# Patient Record
Sex: Female | Born: 1950
Health system: Southern US, Community
[De-identification: ages and names within clinical notes are randomized; demographics above are authoritative.]

## PROBLEM LIST (undated history)

## (undated) DIAGNOSIS — E785 Hyperlipidemia, unspecified: Secondary | ICD-10-CM

## (undated) DIAGNOSIS — R519 Headache, unspecified: Secondary | ICD-10-CM

## (undated) DIAGNOSIS — C801 Malignant (primary) neoplasm, unspecified: Secondary | ICD-10-CM

## (undated) DIAGNOSIS — R51 Headache: Secondary | ICD-10-CM

## (undated) DIAGNOSIS — I10 Essential (primary) hypertension: Secondary | ICD-10-CM

## (undated) DIAGNOSIS — E119 Type 2 diabetes mellitus without complications: Secondary | ICD-10-CM

## (undated) DIAGNOSIS — Z8489 Family history of other specified conditions: Secondary | ICD-10-CM

## (undated) DIAGNOSIS — M1990 Unspecified osteoarthritis, unspecified site: Secondary | ICD-10-CM

## (undated) HISTORY — PX: ELBOW BURSA SURGERY: SHX615

## (undated) HISTORY — PX: OTHER SURGICAL HISTORY: SHX169

## (undated) HISTORY — PX: JOINT REPLACEMENT: SHX530

## (undated) HISTORY — DX: Hyperlipidemia, unspecified: E78.5

## (undated) HISTORY — DX: Malignant (primary) neoplasm, unspecified: C80.1

## (undated) HISTORY — PX: BREAST SURGERY: SHX581

## (undated) HISTORY — DX: Headache, unspecified: R51.9

## (undated) HISTORY — DX: Headache: R51

## (undated) HISTORY — DX: Unspecified osteoarthritis, unspecified site: M19.90

## (undated) HISTORY — PX: FOOT SURGERY: SHX648

## (undated) HISTORY — DX: Type 2 diabetes mellitus without complications: E11.9

## (undated) HISTORY — DX: Essential (primary) hypertension: I10

## (undated) HISTORY — PX: NASAL POLYP EXCISION: SHX2068

---

## 2006-05-12 HISTORY — PX: TOTAL KNEE ARTHROPLASTY: SHX125

## 2008-01-07 ENCOUNTER — Inpatient Hospital Stay (HOSPITAL_COMMUNITY): Admission: RE | Admit: 2008-01-07 | Discharge: 2008-01-11 | Payer: Self-pay | Admitting: Orthopedic Surgery

## 2010-09-24 NOTE — Op Note (Signed)
NAMEDELORUS, LANGWELL NO.:  [id number]   MEDICAL RECORD NO.:  [id number]          PATIENT TYPE:  INP   LOCATION:  0012                         FACILITY:  [facility]   PHYSICIAN:  Ollen Gross, M.D.    DATE OF BIRTH:  [birth year]   DATE OF PROCEDURE:  01/07/2008  DATE OF DISCHARGE:                               OPERATIVE REPORT   PREOPERATIVE DIAGNOSIS:  Osteoarthritis bilateral knees.   POSTOPERATIVE DIAGNOSIS:  Osteoarthritis bilateral knees.   PROCEDURE:  Bilateral total knee arthroplasty.   SURGEON:  Ollen Gross, MD.   ASSISTANT:  Avel Peace, PA-C.   ANESTHESIA:  General with postop epidural.   ESTIMATED BLOOD LOSS:  Minimal.   DRAIN:  Autovac x1 each side.   TOURNIQUET TIME:  On the right 35 minutes at 300 mmHg, on the left 36  minutes at 300 mmHg.   COMPLICATIONS:  None.   CONDITION:  Stable to recovery.   BRIEF CLINICAL NOTE:  Tammy Harris is a 60 year old female with end-stage  arthritis of both knees with progressively worsening pain and  dysfunction.  She has failed nonoperative management and presents now  for bilateral total knee arthroplasty.  We gave her the option of doing  one at a time vs. bilateral and she opted for bilateral given the pain  and dysfunction in both knees.   PROCEDURE IN DETAIL:  After the successful administration of the  epidural catheter and then general anesthetic, the patient is placed  supine on the operating table and the tourniquet is placed high on both  thighs, both lower extremities prepped and draped in the usual sterile  fashion.  Extremities wrapped in Esmarch, knee flexed and tourniquet  inflated to 300 mmHg on the right.  We did the right side first because  that was more symptomatic.  An incision was made with a 10-blade through  the subcutaneous tissue to the level of the extensor mechanism.  A fresh  blade was used to make a medial parapatellar arthrotomy.  The soft  tissue over the proximal and medial  tibia is subperiosteally elevated to  the joint line with the knife and at the semimembranosus bursa with a  Cobb elevator.  Soft tissue laterally is elevated with attention being  paid to avoid the patellar tendon on tibial tubercle.  Patella subluxed  laterally, knee flexed 90 degrees and ACL and PCL removed.  Drill was  used to create a starting hole in the distal femur and the canal was  thoroughly irrigated.  The 5 degree right valgus alignment guide is  placed referencing off the posterior condyles, rotation is marked and  the block pinned to remove 11 mm of the distal femur.  I took 11 because  of a preoperative flexion contracture.  A distal femoral resection is  made an oscillating saw.  Sizing blocks placed, a size 3 is the most  appropriate.  Rotation is marked off the epicondylar axis.  A size 3  cutting block is placed and the anterior, posterior and chamfer cuts  made.   Tibia subluxed forward and menisci removed.  The extramedullary tibial  alignment guide is placed referencing proximally at the medial aspect of  the tibial tubercle and distally along the second metatarsal axis of the  tibial crest.  Block is pinned to remove 10 mm of the nondeficient  lateral side.  Tibial resection is made with an oscillating saw.  A size  3 is the most appropriate tibial component and then the proximal tibia  is prepared with the modular drill and keel punch for a size 3.  Femoral  preparation is completed with the intercondylar cut.   A size 3 mobile bearing tibial trial, size 3 posterior stabilized  femoral trial and a 10 mm posterior stabilized rotating platform insert  trial are placed.  With the 10, full extension is achieved with  excellent varus-valgus, anterior-posterior balance throughout full range  of motion.  Patella was everted and thickness measured to be 22 mm.  Freehand resection was taken down to 13 mm, a 35 template is placed, lug  holes were drilled, trial patella  was placed and it tracks normally.  Osteophytes were removed over the posterior femur with the trial in  place.  All trials were removed and the cut bone surfaces are prepared  with pulsatile lavage.  Cement is mixed and once ready for implantation  the size 3 mobile bearing tibial tray, size 3 posterior stabilized femur  and 35 patella are cemented into place and patella was held with the  clamp.  The 10 mm trial inserts placed, knee held in full extension and  all extruded cement removed.  When the cement fully hardened then the  permanent 10 mm posterior stabilized rotating platform insert is placed  into the tibial tray.  The wound was copiously irrigated with saline  solution.  The extensor mechanism was closed over an Autovac drain with  interrupted #1 PDS suture.  Flexion against gravity is 135 degrees.  Subcu is then closed with interrupted #2-0 Vicryl and subcuticular  running #4-0 Monocryl.   The left side is then addressed.  The left lower extremity is wrapped in  Esmarch, knee flexed and tourniquet inflated to 300 mmHg.  A midline  incision was made with a 10-blade through subcutaneous tissue to the  level of the extensor mechanism.  A fresh blade was used to make a  medial parapatellar arthrotomy.  The same approach was used as on the  right.  Soft tissue balancing is performed.  Patella was subluxed and  knee flexed 90 degrees.  ACL and PCL were removed.  The drill was used  to create a starting hole in the distal femur, canal was thoroughly  irrigated.  A 5 degree left valgus alignment guide is placed.  Referencing off the posterior condyles, rotation is marked, block pinned  to remove 11 mm of the distal femur because of a preop flexion  contracture.  Distal femoral resection is made with an oscillating saw.  A size 3 is again the most appropriate size.  Rotation is marked at the  epicondylar axis.  A size 3 cutting block is placed and the anterior,  posterior and chamfer  cuts made.   Tibia subluxed forward, menisci removed.  Extramedullary tibial  alignment guide is placed referencing proximally off the medial aspect  of the tibial tubercle and distally along the second metatarsal axis  tibial crest.  Blocks pinned to remove about 4 mm from the more  deficient medial side.  Both sides had some deficiency on this left  tibia.  Tibial resection is made  with an oscillating saw.  A size 3 is  the most appropriate tibial component and the proximal tibia is prepared  with the modular drill and keel punch for a size 3.  Femoral preparation  was completed with the intercondylar cut.   A size 3 mobile bearing tibial trial, size 3 posterior stabilized  femoral trial, and a 10 mm posterior stabilized rotating platform insert  trial are placed.  Full extension is achieved with excellent varus and  valgus, anterior and posterior balance throughout, full range of motion.  Patella was everted, thickness measured to be 22 mm.  Freehand resection  was taken to 13 mm, a 35 template is placed, lug holes were drilled,  trial patella was placed and it tracks normally.  Osteophytes removed  off the posterior femur with the trial in place.  All trials were  removed then the cut bone surfaces prepared with pulsatile lavage.  Cement was mixed and once ready for implantation a size three mobile  bearing tibial tray, size 3 posterior stabilized femur and 35 patella  are cemented into place and the patella was held with a clamp.  Trial 10  mm inserts were placed, the knee held into full extension and all  extruded cement removed.  Once cement was fully hardened, then the  permanent 10 mm posterior stabilized rotating platform insert is placed  into the tibial tray.  The wound was copiously irrigated with saline  solution and the extensor mechanism closed over an Autovac drain with  interrupted #1 PDS.  Flexion against gravity was 135 degrees.  Tourniquet was released for a total  time of 36 minutes.  The subcu was  closed with interrupted #2-0 Vicryl, subcuticular running #4-0 Monocryl.  Both incisions are cleaned and dried and Steri-Strips and bulky sterile  dressings applied.  The drains had already been hooked to suction.  She  is then placed into knee immobilizers, awakened and transported to  recovery in stable condition.      Ollen Gross, M.D.  Electronically Signed     FA/MEDQ  D:  01/07/2008  T:  01/07/2008  Job:  [id number]

## 2010-09-27 NOTE — Discharge Summary (Signed)
Tammy Harris, Tammy Harris             ACCOUNT NO.:  [id number]   MEDICAL RECORD NO.:  [id number]          PATIENT TYPE:  INP   LOCATION:  1604                         FACILITY:  [facility]   PHYSICIAN:  Ollen Gross, M.D.    DATE OF BIRTH:  1950/12/21   DATE OF ADMISSION:  01/07/2008  DATE OF DISCHARGE:  01/11/2008                               DISCHARGE SUMMARY   ADMISSION DIAGNOSES:  1. Osteoarthritis of bilateral knees.  2. Hypertension.  3. Hypercholesterolemia.  4. Diarrhea, type irritable bowel syndrome.  5. Vitamin D deficiency.  6. Mild stress urinary incontinence.  7. Hyperglycemia.  8. Postmenopausal.   DISCHARGE DIAGNOSES:  1. Osteoarthritis of bilateral knees, status post bilateral total knee      replacement arthroplasty.  2. Postoperative blood loss anemia.  3. Hypertension.  4. Hypercholesterolemia.  5. Diarrhea, type irritable bowel syndrome.  6. Vitamin D deficiency.  7. Mild stress urinary incontinence.  8. Hyperglycemia.  9. Postmenopausal.   PROCEDURE:  January 07, 2008, bilateral total knee replacement  arthroplasties.   SURGEON:  Ollen Gross, M.D.   ASSISTANT:  Alexzandrew L. Perkins, P.A.C.   ANESTHESIA:  General, postoperative epidural.   CONSULTATIONS:  None.   BRIEF HISTORY:  Tammy Harris is a 60 year old female with end-stage arthritis  of both knees, progressive worsening pain and dysfunction, who failed  operative management and now presents for bilateral total knee  replacement arthroplasties.  She was given the option to do one at a  time versus bilateral.  She opted to do bilateral given the pain.   LABORATORY DATA:  Preop CBC showed hemoglobin 13.5, hematocrit 40.7,  white cell count 5.4, platelets 237, postop hemoglobin 10.1, drifted  down to 8.8, then 8.5, last H&H was 8.3 and 24.7.  PT/PTT preop 13.5 and  22 respectively.  INR 1.0.  Serial pro-times followed.  PT/INR 20.6 and  1.7.  Chem panel on admission all within normal limits.  Serial  BMETs  were followed.  Electrolytes remained within normal limits.  Glucose  went up from 89 to 135,  back down to 121.  Preop UA:  Large hemoglobin,  0-2 white cells.  Blood group type O+.   DIAGNOSTICS:  1. Chest x-ray December 31, 2007:  No acute disease.  2. EKG December 20, 2007:  Sinus rhythm, this is an unconfirmed EKG.   HOSPITAL COURSE:  The patient was admitted to [facility] and  tolerated the procedure well.  Later transferred to the recovery room on  the orthopedic floor.  She did have an epidural placed postoperatively  which was managed by anesthesia.  Epidural was left in for 48 hours.  On  postop day 1, she was actually pretty comfortable and had good control  with the epidural.  She was started on Coumadin, but was not started  until the evening of postop day 1.  By postop day 2, she was doing well.  Epidural was removed and she was started on Lovenox.  After the epidural  was removed, Foley was also discontinued later that day.  Dressings were  changed.  Both incisions were healing well.  Continued just to do bed to  chair transfers with a few steps.  By postop day 3, however, she was  doing amazingly well with her therapy.  She was up walking about 135  feet.  She had already been up the hallway on the evening of day 2.  She  has continued to progress well.  Day 3, she had remained on the Lovenox  bridge until her INR was therapeutic.  Her hemoglobin was low down to  8.5 noted on postop day 3, but she was asymptomatic with this and  hemodynamically stable.  Her blood pressure medications were held though  because her systolic pressure was only about 110-115.  It would be  resumed if her pressure came back up.  Continued to progress well.  By  postop day 4, she was doing great, walking the hall.  She was tolerating  her meds.  Her pressure was stable.  Despite the low hemoglobin of 8.3,  she was hemodynamically stable with no complaints, wanted to go home and   arrangements were made.  She was discharged home later that day.   DISPOSITION:  The patient was discharged home on January 11, 2008.  Weightbearing as tolerated to both lower extremities.  Home health PT  and home nursing total knee protocol.   DISCHARGE MEDICATIONS:  1. Coumadin.  2. Nu-Iron.  3. Percocet.  4. Robaxin.   DIET:  Low-sodium, low-cholesterol, heart-healthy diet.   FOLLOW UP:  Follow up on Thursday or Friday on January 20, 2008 or  January 21, 2008.  Please contact the office at [phone] for  appointment.   CONDITION ON DISCHARGE:  Improving.      Alexzandrew L. Perkins, P.A.C.      Ollen Gross, M.D.  Electronically Signed    ALP/MEDQ  D:  02/03/2008  T:  02/04/2008  Job:  [id number]   cc:   Ollen Gross, M.D.  Fax: [phone]   Stan Head. Cleta Alberts, M.D.  Fax: [phone]

## 2010-09-27 NOTE — H&P (Signed)
NAMESHERISSA, TENENBAUM             ACCOUNT NO.:  [id number]   MEDICAL RECORD NO.:  [id number]          PATIENT TYPE:  INP   LOCATION:  NA                           FACILITY:  [facility]   PHYSICIAN:  Ollen Gross, M.D.    DATE OF BIRTH:  [birth year]   DATE OF ADMISSION:  01/07/2008  DATE OF DISCHARGE:                              HISTORY & PHYSICAL   CHIEF COMPLAINT:  Bilateral knee pain.   HISTORY OF PRESENT ILLNESS:  The patient is a 60 year old female who has  seen by Dr. Lequita Halt for ongoing bilateral knee pain.  She had problems  with her knees for quite some time now.  Historically the left has been  more symptomatic than the right.  Right now the right has been hurting a  little bit more recently.  She has been seeing Dr. Charlett Blake in the past  and recommended recommend knee replacement.  She was subsequently seen  by Dr. Lequita Halt in second opinion.  She has been seen in the office and x-  rays show end-stage arthritis of the left knee and also patellofemoral  bone-on-bone, as well as significant medial narrowing on the right knee.  She is at a point now, due to her significant arthritis, most  predictable way of improving pain and function will be total knee  arthroplasty.  She would like to have both done at the same time.  Risks  and benefits have been discussed.  She elects to proceed with surgery.  She has been seen preoperative by Dr. Cleta Alberts and felt to be low risk and  cleared medically for surgery.   ALLERGIES:  SULFA.   CURRENT MEDICATIONS:  Diclofenac, lisinopril, hydrochlorothiazide,  amitriptyline, Simvastatin, baby aspirin, loratadine, glucosamine  chondroitin, chromium, vitamin E, multivitamin, potassium, B complex,  fish oil, flaxseed oil, vitamin C, calcium, Estroven.   PAST MEDICAL HISTORY:  Hypertension, hypercholesterolemia and diarrhea-  type irritable bowel syndrome, vitamin D deficiency, a little bit of  mild stress urinary incontinence, hyperglycemia,  postmenopausal.   PAST SURGICAL HISTORY:  Cesarean section x2, heel spur surgery, elbow  surgery and carpal tunnel surgery.   SOCIAL HISTORY:  Married, housewife, nonsmoker.  No alcohol.  Husband  will be assisting with care after surgery.   FAMILY HISTORY:  Father deceased at 42, Parkinson's and heart disease.  Mother living, age 56, hypertension and elevated cholesterol.  She has 2  sisters and a brother.   REVIEW OF SYSTEMS:  GENERAL:  No fevers, chills or night sweats.  Neurologic:  No seizures, syncope or paralysis.  RESPIRATORY:  No  shortness of breath, productive cough or hemoptysis.  CARDIOVASCULAR:  No chest pain, angina, orthopnea.  GI:  No nausea, vomiting, diarrhea or  constipation.  GU:  No dysuria or hematuria, or discharge.  MUSCULOSKELETAL:  Bilateral knees.   PHYSICAL EXAMINATION:  VITAL SIGNS:  Pulse 76, respiration 12, blood  pressure 118/86.  GENERAL:  A 56-year white female, well-nourished, well-developed, no  acute distress.  She is alert and oriented, cooperative, pleasant,  accompanied by her husband, slightly overweight.  HEENT:  Normocephalic, atraumatic.  Pupils are round and  reactive.  Oropharynx clear.  EOMs intact.  NECK:  Supple.  CHEST:  Clear.  HEART:  Regular rate and rhythm.  No murmur, S1, S2 noted.  ABDOMEN:  Soft, round protuberant abdomen.  Bowel sounds present.  RECTAL, BREAST, GENITALIA:  Not done, refer to present illness.  EXTREMITIES:  Knees, right knee range of motion 5-120, marked crepitus  tender more medial than the lateral.  Left knee range of motion 10-115,  marked crepitus tender more medial than lateral.   IMPRESSION:  Osteoarthritis of bilateral knees.   PLAN:  The patient admitted to [facility] to undergo bilateral  total knee replacement arthroplasty.  Surgery will be performed by Dr.  Ollen Gross.      Alexzandrew L. Perkins, P.A.C.      Ollen Gross, M.D.  Electronically Signed    ALP/MEDQ  D:   01/06/2008  T:  01/06/2008  Job:  [id number]   cc:   Ollen Gross, M.D.  Fax: [phone]   Stan Head. Cleta Alberts, M.D.  Fax: [phone]

## 2011-02-12 LAB — CBC
Platelets: 196
RDW: 13.2

## 2011-02-12 LAB — PROTIME-INR
INR: 1.7 — ABNORMAL HIGH
Prothrombin Time: 20.6 — ABNORMAL HIGH

## 2011-02-12 LAB — GLUCOSE, CAPILLARY: Glucose-Capillary: 93

## 2011-08-26 ENCOUNTER — Ambulatory Visit (INDEPENDENT_AMBULATORY_CARE_PROVIDER_SITE_OTHER): Payer: Managed Care, Other (non HMO) | Admitting: Emergency Medicine

## 2011-08-26 ENCOUNTER — Ambulatory Visit: Payer: Managed Care, Other (non HMO)

## 2011-08-26 ENCOUNTER — Encounter: Payer: Self-pay | Admitting: Emergency Medicine

## 2011-08-26 VITALS — BP 130/91 | HR 87 | Temp 98.8°F | Resp 16 | Ht 64.0 in | Wt 283.2 lb

## 2011-08-26 DIAGNOSIS — Z0000 Encounter for general adult medical examination without abnormal findings: Secondary | ICD-10-CM

## 2011-08-26 DIAGNOSIS — E785 Hyperlipidemia, unspecified: Secondary | ICD-10-CM

## 2011-08-26 DIAGNOSIS — M1990 Unspecified osteoarthritis, unspecified site: Secondary | ICD-10-CM

## 2011-08-26 DIAGNOSIS — M25579 Pain in unspecified ankle and joints of unspecified foot: Secondary | ICD-10-CM

## 2011-08-26 DIAGNOSIS — I10 Essential (primary) hypertension: Secondary | ICD-10-CM

## 2011-08-26 DIAGNOSIS — M25572 Pain in left ankle and joints of left foot: Secondary | ICD-10-CM

## 2011-08-26 DIAGNOSIS — E119 Type 2 diabetes mellitus without complications: Secondary | ICD-10-CM

## 2011-08-26 DIAGNOSIS — L309 Dermatitis, unspecified: Secondary | ICD-10-CM

## 2011-08-26 DIAGNOSIS — M159 Polyosteoarthritis, unspecified: Secondary | ICD-10-CM

## 2011-08-26 DIAGNOSIS — R21 Rash and other nonspecific skin eruption: Secondary | ICD-10-CM

## 2011-08-26 LAB — POCT URINALYSIS DIPSTICK
Bilirubin, UA: NEGATIVE
Ketones, UA: NEGATIVE
Spec Grav, UA: 1.025
pH, UA: 7

## 2011-08-26 LAB — POCT UA - MICROSCOPIC ONLY
Bacteria, U Microscopic: NEGATIVE
Casts, Ur, LPF, POC: NEGATIVE
Crystals, Ur, HPF, POC: NEGATIVE
Mucus, UA: NEGATIVE

## 2011-08-26 LAB — IFOBT (OCCULT BLOOD): IFOBT: NEGATIVE

## 2011-08-26 MED ORDER — TETANUS-DIPHTH-ACELL PERTUSSIS 5-2.5-18.5 LF-MCG/0.5 IM SUSP
0.5000 mL | Freq: Once | INTRAMUSCULAR | Status: AC
  Administered 2011-08-26: 0.5 mL via INTRAMUSCULAR

## 2011-08-26 NOTE — Progress Notes (Signed)
  Subjective:    Patient ID: Tammy Harris, female    DOB: [birth year], 61 y.o.   MRN: [id number]  HPI patient enters for her yearly physical exam    Review of Systems  Constitutional: Negative.   Eyes: Negative.   Respiratory: Negative.   Cardiovascular: Negative.   Gastrointestinal: Negative.   Genitourinary:       She has difficulty with urinary incontinence.  Musculoskeletal:       He's had a lot of pain and difficulty inside of her left ankle. She may have stepped off a curb and injured herself the  Skin: Negative.   Neurological: Positive for headaches.  Hematological: Negative.   Psychiatric/Behavioral: Negative.        Objective:   Physical Exam  Constitutional: She appears well-developed and well-nourished.  HENT:  Right Ear: External ear normal.  Left Ear: External ear normal.  Eyes: Pupils are equal, round, and reactive to light.  Neck: No thyromegaly present.  Cardiovascular: Normal rate, regular rhythm, normal heart sounds and intact distal pulses.  Exam reveals no gallop and no friction rub.   No murmur heard. Pulmonary/Chest: Effort normal and breath sounds normal. No respiratory distress. She has no wheezes. She has no rales. She exhibits no tenderness.  Abdominal: She exhibits no distension. There is no tenderness. There is no rebound and no guarding.  Genitourinary: Vagina normal and uterus normal.  Musculoskeletal:       There is tenderness to palpation distal to the medial malleolus of the left ankle.  Neurological: She is alert. No cranial nerve deficit. Coordination normal.  Skin:       There is a red scaly patch medial left calf which measures approximately 2 cm.    [facility] reading (PRIMARY) by  Dr Cleta Alberts x-ray shows significant  joint space narrowing and arthritic change involving ankle joint as well as the spurring on the calcaneus  EKG left axis deviation no acute change      Assessment & Plan:  Major complaints or is to follow up mainly on her  diabetes high blood pressure. Is having some hot flashes they're not major problem she is urinary symptoms I suspect are more weight related to his musculoskeletal discomfort in her left ankle good picture for this. No change in medications. She will continue exercises on a recumbent bike or by swimming. She is placed in [place]-O. Triamcinolone mixed with eucerin for her rash

## 2011-08-26 NOTE — Patient Instructions (Signed)
Eczema Atopic dermatitis, or eczema, is an inherited type of sensitive skin. Often people with eczema have a family history of allergies, asthma, or hay fever. It causes a red itchy rash and dry scaly skin. The itchiness may occur before the skin rash and may be very intense. It is not contagious. Eczema is generally worse during the cooler winter months and often improves with the warmth of summer. Eczema usually starts showing signs in infancy. Some children outgrow eczema, but it may last through adulthood. Flare-ups may be caused by:  Eating something or contact with something you are sensitive or allergic to.   Stress.  DIAGNOSIS  The diagnosis of eczema is usually based upon symptoms and medical history. TREATMENT  Eczema cannot be cured, but symptoms usually can be controlled with treatment or avoidance of allergens (things to which you are sensitive or allergic to).  Controlling the itching and scratching.   Use over-the-counter antihistamines as directed for itching. It is especially useful at night when the itching tends to be worse.   Use over-the-counter steroid creams as directed for itching.   Scratching makes the rash and itching worse and may cause impetigo (a skin infection) if fingernails are contaminated (dirty).   Keeping the skin well moisturized with creams every day. This will seal in moisture and help prevent dryness. Lotions containing alcohol and water can dry the skin and are not recommended.   Limiting exposure to allergens.   Recognizing situations that cause stress.   Developing a plan to manage stress.  HOME CARE INSTRUCTIONS   Take prescription and over-the-counter medicines as directed by your caregiver.   Do not use anything on the skin without checking with your caregiver.   Keep baths or showers short (5 minutes) in warm (not hot) water. Use mild cleansers for bathing. You may add non-perfumed bath oil to the bath water. It is best to avoid soap and  bubble bath.   Immediately after a bath or shower, when the skin is still damp, apply a moisturizing ointment to the entire body. This ointment should be a petroleum ointment. This will seal in moisture and help prevent dryness. The thicker the ointment the better. These should be unscented.   Keep fingernails cut short and wash hands often. If your child has eczema, it may be necessary to put soft gloves or mittens on your child at night.   Dress in clothes made of cotton or cotton blends. Dress lightly, as heat increases itching.   Avoid foods that may cause flare-ups. Common foods include cow's milk, peanut butter, eggs and wheat.   Keep a child with eczema away from anyone with fever blisters. The virus that causes fever blisters (herpes simplex) can cause a serious skin infection in children with eczema.  SEEK MEDICAL CARE IF:   Itching interferes with sleep.   The rash gets worse or is not better within one week following treatment.   The rash looks infected (pus or soft yellow scabs).   You or your child has an oral temperature above 102 F (38.9 C).   Your baby is older than 3 months with a rectal temperature of 100.5 F (38.1 C) or higher for more than 1 day.   The rash flares up after contact with someone who has fever blisters.  SEEK IMMEDIATE MEDICAL CARE IF:   Your baby is older than 3 months with a rectal temperature of 102 F (38.9 C) or higher.   Your baby is older than  3 months or younger with a rectal temperature of 100.4 F (38 C) or higher.  Document Released: 04/25/2000 Document Revised: 04/17/2011 Document Reviewed: 02/28/2009 [facility] Patient Information 2012 [place], [place].Osteoarthritis Osteoarthritis is the most common form of arthritis. It is redness, soreness, and swelling (inflammation) affecting the cartilage. Cartilage acts as a cushion, covering the ends of bones where they meet to form a joint. CAUSES  Over time, the cartilage begins to wear  away. This causes bone to rub on bone. This produces pain and stiffness in the affected joints. Factors that contribute to this problem are:  Excessive body weight.   Age.   Overuse of joints.  SYMPTOMS   People with osteoarthritis usually experience joint pain, swelling, or stiffness.   Over time, the joint may lose its normal shape.   Small deposits of bone (osteophytes) may grow on the edges of the joint.   Bits of bone or cartilage can break off and float inside the joint space. This may cause more pain and damage.   Osteoarthritis can lead to depression, anxiety, feelings of helplessness, and limitations on daily activities.  The most commonly affected joints are in the:  Ends of the fingers.   Thumbs.   Neck.   Lower back.   Knees.   Hips.  DIAGNOSIS  Diagnosis is mostly based on your symptoms and exam. Tests may be helpful, including:  X-rays of the affected joint.   A computerized magnetic scan (MRI).   Blood tests to rule out other types of arthritis.   Joint fluid tests. This involves using a needle to draw fluid from the joint and examining the fluid under a microscope.  TREATMENT  Goals of treatment are to control pain, improve joint function, maintain a normal body weight, and maintain a healthy lifestyle. Treatment approaches may include:  A prescribed exercise program with rest and joint relief.   Weight control with nutritional education.   Pain relief techniques such as:   Properly applied heat and cold.   Electric pulses delivered to nerve endings under the skin (transcutaneous electrical nerve stimulation, TENS).   Massage.   Certain supplements. Ask your caregiver before using any supplements, especially in combination with prescribed drugs.   Medicines to control pain, such as:   Acetaminophen.   Nonsteroidal anti-inflammatory drugs (NSAIDs), such as naproxen.   Narcotic or central-acting agents, such as tramadol. This drug carries  a risk of addiction and is generally prescribed for short-term use.   Corticosteroids. These can be given orally or as injection. This is a short-term treatment, not recommended for routine use.   Surgery to reposition the bones and relieve pain (osteotomy) or to remove loose pieces of bone and cartilage. Joint replacement may be needed in advanced states of osteoarthritis.  HOME CARE INSTRUCTIONS  Your caregiver can recommend specific types of exercise. These may include:  Strengthening exercises. These are done to strengthen the muscles that support joints affected by arthritis. They can be performed with weights or with exercise bands to add resistance.   Aerobic activities. These are exercises, such as brisk walking or low-impact aerobics, that get your heart pumping. They can help keep your lungs and circulatory system in shape.   Range-of-motion activities. These keep your joints limber.   Balance and agility exercises. These help you maintain daily living skills.  Learning about your condition and being actively involved in your care will help improve the course of your osteoarthritis. SEEK MEDICAL CARE IF:   You feel hot  or your skin turns red.   You develop a rash in addition to your joint pain.   You have an oral temperature above 102 F (38.9 C).  FOR MORE INFORMATION  National Institute of Arthritis and Musculoskeletal and Skin Diseases: www.niams.http://www.myers.net/ [employer] on Aging: https://walker.com/ American College of Rheumatology: www.rheumatology.org Document Released: 04/28/2005 Document Revised: 04/17/2011 Document Reviewed: 08/09/2009 [facility] Patient Information 2012 [place], [place].

## 2011-08-27 LAB — LIPID PANEL
Cholesterol: 212 mg/dL — ABNORMAL HIGH (ref 0–200)
HDL: 57 mg/dL (ref >39)
Total CHOL/HDL Ratio: 3.7 Ratio

## 2011-08-27 LAB — CBC WITH DIFFERENTIAL/PLATELET
Basophils Absolute: 0 10*3/uL (ref 0.0–0.1)
Eosinophils Relative: 4 % (ref 0–5)
HCT: 44.6 % (ref 36.0–46.0)
Lymphocytes Relative: 29 % (ref 12–46)
MCHC: 32.3 g/dL (ref 30.0–36.0)
MCV: 92 fL (ref 78.0–100.0)
Monocytes Absolute: 0.5 10*3/uL (ref 0.1–1.0)
RDW: 13.2 % (ref 11.5–15.5)
WBC: 8.8 10*3/uL (ref 4.0–10.5)

## 2011-08-27 LAB — TSH: TSH: 2.029 u[IU]/mL (ref 0.350–4.500)

## 2011-08-27 LAB — COMPREHENSIVE METABOLIC PANEL
AST: 28 U/L (ref 0–37)
BUN: 18 mg/dL (ref 6–23)
Calcium: 9.9 mg/dL (ref 8.4–10.5)
Chloride: 105 mEq/L (ref 96–112)
Creat: 0.7 mg/dL (ref 0.50–1.10)

## 2011-08-29 ENCOUNTER — Telehealth: Payer: Self-pay

## 2011-08-29 LAB — PAP IG (IMAGE GUIDED)

## 2011-08-29 NOTE — Telephone Encounter (Signed)
Patient returned call for her labs  Please call with results.  [phone]

## 2011-10-07 ENCOUNTER — Ambulatory Visit: Payer: Managed Care, Other (non HMO) | Admitting: Emergency Medicine

## 2011-10-07 ENCOUNTER — Ambulatory Visit: Payer: Managed Care, Other (non HMO)

## 2011-10-07 VITALS — BP 136/91 | HR 98 | Temp 98.6°F | Resp 20 | Ht 64.0 in | Wt 286.0 lb

## 2011-10-07 DIAGNOSIS — M19079 Primary osteoarthritis, unspecified ankle and foot: Secondary | ICD-10-CM

## 2011-10-07 DIAGNOSIS — M25579 Pain in unspecified ankle and joints of unspecified foot: Secondary | ICD-10-CM

## 2011-10-07 NOTE — Progress Notes (Signed)
  Subjective:    Patient ID: Tammy Harris, female    DOB: [birth year], 61 y.o.   MRN: [id number]  HPI patient here to followup her left ankle injury. Last film revealed severe degenerative arthritic changes but no fracture. She's continued to have a significant amount of discomfort.    Review of Systems     Objective:   Physical Exam   [facility] reading (PRIMARY) by  Dr. Cleta Alberts there is a narrow medial joint space no other bony lesions are noted.  There is limited range of motion of the left ankle. There is a significant amount of swelling.     Assessment & Plan:  The patient is already require surgery on both knees. She now has arthritic changes of the left ankle. We'll get an opinion from Dr. Merlyn Albert or Dr. Lestine Box regarding her Left ankle

## 2011-10-08 ENCOUNTER — Telehealth: Payer: Self-pay

## 2011-10-08 NOTE — Telephone Encounter (Signed)
Spoke with patient and let her know that xray was ready for pick up at front desk.

## 2011-10-08 NOTE — Telephone Encounter (Signed)
Pt is needing to get xrays for her ankle and would like for husband to be able to pick them up this afternoon after work for her appt tomorrow at 100.  She was seen yesterday at 56 i believe and was unable to get a copy at that time  Best number [phone]

## 2011-10-16 ENCOUNTER — Encounter: Payer: Self-pay | Admitting: Family Medicine

## 2011-12-08 ENCOUNTER — Other Ambulatory Visit: Payer: Self-pay | Admitting: Emergency Medicine

## 2012-02-19 ENCOUNTER — Other Ambulatory Visit: Payer: Self-pay | Admitting: Emergency Medicine

## 2012-03-24 ENCOUNTER — Other Ambulatory Visit: Payer: Self-pay | Admitting: Emergency Medicine

## 2012-03-24 ENCOUNTER — Other Ambulatory Visit: Payer: Self-pay | Admitting: Physician Assistant

## 2012-03-30 ENCOUNTER — Encounter: Payer: Self-pay | Admitting: Emergency Medicine

## 2012-03-30 ENCOUNTER — Ambulatory Visit: Payer: Managed Care, Other (non HMO) | Admitting: Emergency Medicine

## 2012-03-30 ENCOUNTER — Ambulatory Visit: Payer: Managed Care, Other (non HMO)

## 2012-03-30 VITALS — BP 139/81 | HR 93 | Temp 98.5°F | Resp 16 | Ht 64.5 in | Wt 292.0 lb

## 2012-03-30 DIAGNOSIS — I10 Essential (primary) hypertension: Secondary | ICD-10-CM

## 2012-03-30 DIAGNOSIS — M79672 Pain in left foot: Secondary | ICD-10-CM

## 2012-03-30 DIAGNOSIS — M79609 Pain in unspecified limb: Secondary | ICD-10-CM

## 2012-03-30 DIAGNOSIS — E785 Hyperlipidemia, unspecified: Secondary | ICD-10-CM

## 2012-03-30 DIAGNOSIS — E1169 Type 2 diabetes mellitus with other specified complication: Secondary | ICD-10-CM | POA: Insufficient documentation

## 2012-03-30 DIAGNOSIS — I152 Hypertension secondary to endocrine disorders: Secondary | ICD-10-CM | POA: Insufficient documentation

## 2012-03-30 DIAGNOSIS — E119 Type 2 diabetes mellitus without complications: Secondary | ICD-10-CM

## 2012-03-30 DIAGNOSIS — E782 Mixed hyperlipidemia: Secondary | ICD-10-CM

## 2012-03-30 DIAGNOSIS — Z23 Encounter for immunization: Secondary | ICD-10-CM

## 2012-03-30 LAB — COMPREHENSIVE METABOLIC PANEL
AST: 20 U/L (ref 0–37)
Albumin: 4.1 g/dL (ref 3.5–5.2)
BUN: 20 mg/dL (ref 6–23)
Calcium: 10 mg/dL (ref 8.4–10.5)
Chloride: 105 mEq/L (ref 96–112)
Glucose, Bld: 119 mg/dL — ABNORMAL HIGH (ref 70–99)
Potassium: 4.3 mEq/L (ref 3.5–5.3)
Sodium: 142 mEq/L (ref 135–145)
Total Protein: 6.9 g/dL (ref 6.0–8.3)

## 2012-03-30 LAB — GLUCOSE, POCT (MANUAL RESULT ENTRY): POC Glucose: 126 mg/dl — AB (ref 70–99)

## 2012-03-30 LAB — LIPID PANEL
Cholesterol: 217 mg/dL — ABNORMAL HIGH (ref 0–200)
VLDL: 25 mg/dL (ref 0–40)

## 2012-03-30 NOTE — Progress Notes (Signed)
  Subjective:    Patient ID: Tammy Harris, female    DOB: [birth year], 61 y.o.   MRN: [id number]  HPI patient here for followup high blood pressure high cholesterol and diabetes. She's had a lot of trouble with her foot and continues to gain weight. She is under the care of Dr. Lestine Box for her foot ankle injury that has been slow to get better. She denies chest pain shortness of breath or any other specific complaints at the present time    Review of Systems     Objective:   Physical Exam physical exam reveals an obese female who is in no distress. Her neck is supple. Her chest is clear. Her cardiac exam is unremarkable  There is a 3 x 3 cm ecchymotic area over the mid left foot.  [facility] reading (PRIMARY) by  Dr. Cleta Alberts No fracture   Results for orders placed in visit on 03/30/12  POCT GLYCOSYLATED HEMOGLOBIN (HGB A1C)      Component Value Range   Hemoglobin A1C 5.7    GLUCOSE, POCT (MANUAL RESULT ENTRY)      Component Value Range   POC Glucose 126 (*) 70 - 99 mg/dl       Assessment & Plan:  Medical problems are stable at present. She does have a contusion to her left foot which is improving. X-rays were negative of this area. Her medical problems are under good control except for continued weight gain and this was addressed at this visit

## 2012-03-31 ENCOUNTER — Encounter: Payer: Self-pay | Admitting: Family Medicine

## 2012-04-23 ENCOUNTER — Other Ambulatory Visit: Payer: Self-pay | Admitting: Emergency Medicine

## 2012-04-27 ENCOUNTER — Other Ambulatory Visit: Payer: Self-pay | Admitting: Emergency Medicine

## 2012-05-24 ENCOUNTER — Other Ambulatory Visit: Payer: Self-pay | Admitting: Physician Assistant

## 2012-06-04 ENCOUNTER — Other Ambulatory Visit: Payer: Self-pay | Admitting: Emergency Medicine

## 2012-06-22 ENCOUNTER — Other Ambulatory Visit: Payer: Self-pay | Admitting: Physician Assistant

## 2012-06-22 NOTE — Telephone Encounter (Signed)
Needs office visit.

## 2012-08-04 ENCOUNTER — Other Ambulatory Visit: Payer: Self-pay | Admitting: Physician Assistant

## 2012-08-24 ENCOUNTER — Other Ambulatory Visit: Payer: Self-pay | Admitting: Emergency Medicine

## 2012-08-25 ENCOUNTER — Telehealth: Payer: Self-pay

## 2012-08-25 MED ORDER — METHOCARBAMOL 500 MG PO TABS: 500.0000 mg | ORAL_TABLET | Freq: Three times a day (TID) | ORAL | Status: DC

## 2012-08-25 NOTE — Telephone Encounter (Signed)
Patient was denied her  methocarbamol (ROBAXIN) 500 MG tablet She has an appointment with Dr. Cleta Alberts May 20th.  She states she had one refill remaining "clearly she is not abusing the medication"   CBN:  [phone]

## 2012-08-25 NOTE — Telephone Encounter (Signed)
Rx sent to pharmacy. Follow up with Dr. Cleta Alberts as planned.

## 2012-08-25 NOTE — Telephone Encounter (Signed)
Please advise on refill, was denied for office visit needed, but patient has office visit scheduled for May 20th.

## 2012-08-25 NOTE — Telephone Encounter (Signed)
Thanks, patient advised.  

## 2012-09-21 ENCOUNTER — Other Ambulatory Visit: Payer: Self-pay | Admitting: Physician Assistant

## 2012-09-28 ENCOUNTER — Encounter: Payer: Self-pay | Admitting: Emergency Medicine

## 2012-09-28 ENCOUNTER — Ambulatory Visit: Payer: Managed Care, Other (non HMO) | Admitting: Emergency Medicine

## 2012-09-28 VITALS — BP 139/71 | HR 87 | Temp 98.1°F | Resp 16 | Ht 64.5 in | Wt 304.6 lb

## 2012-09-28 DIAGNOSIS — S91009A Unspecified open wound, unspecified ankle, initial encounter: Secondary | ICD-10-CM

## 2012-09-28 DIAGNOSIS — S81801A Unspecified open wound, right lower leg, initial encounter: Secondary | ICD-10-CM

## 2012-09-28 DIAGNOSIS — E785 Hyperlipidemia, unspecified: Secondary | ICD-10-CM

## 2012-09-28 DIAGNOSIS — I10 Essential (primary) hypertension: Secondary | ICD-10-CM

## 2012-09-28 DIAGNOSIS — S81009A Unspecified open wound, unspecified knee, initial encounter: Secondary | ICD-10-CM

## 2012-09-28 DIAGNOSIS — E119 Type 2 diabetes mellitus without complications: Secondary | ICD-10-CM

## 2012-09-28 DIAGNOSIS — S81809A Unspecified open wound, unspecified lower leg, initial encounter: Secondary | ICD-10-CM

## 2012-09-28 LAB — COMPREHENSIVE METABOLIC PANEL
ALT: 32 U/L (ref 0–35)
Alkaline Phosphatase: 57 U/L (ref 39–117)
Sodium: 141 mEq/L (ref 135–145)
Total Bilirubin: 0.6 mg/dL (ref 0.3–1.2)
Total Protein: 6.7 g/dL (ref 6.0–8.3)

## 2012-09-28 LAB — LIPID PANEL
HDL: 56 mg/dL (ref >39)
LDL Cholesterol: 105 mg/dL — ABNORMAL HIGH (ref 0–99)
Total CHOL/HDL Ratio: 3.5 Ratio

## 2012-09-28 LAB — GLUCOSE, POCT (MANUAL RESULT ENTRY): POC Glucose: 146 mg/dl — AB (ref 70–99)

## 2012-09-28 MED ORDER — MUPIROCIN 2 % EX OINT: TOPICAL_OINTMENT | Freq: Three times a day (TID) | CUTANEOUS | Status: DC

## 2012-09-28 MED ORDER — DOXYCYCLINE HYCLATE 100 MG PO CAPS: 100.0000 mg | ORAL_CAPSULE | Freq: Two times a day (BID) | ORAL | Status: DC

## 2012-09-28 NOTE — Progress Notes (Signed)
  Subjective:    Patient ID: Tammy Harris, female    DOB: [birth year], 62 y.o.   MRN: [id number]  HPI  62 YO female patient comes in today for her 6 month DM check. Pt does not check sugars at home. She knows her weight is a problem. It has always fluctuated. She eats healthy. Her ankle problems give her problems with exercise. She is having problems with her hips.   Patient has a sore located on her groin area, more so on her leg that she would like evaluated. Sore began forming 3 weeks ago- it is getting better but is seems slow. She has been using antibiotic ointment on it- [facility].  Unproductive cough at random times. She has been having this for a long while now. She does not think it is of a large concern but wanted to mention it again.     Review of Systems     Objective:   Physical Exam chest is clear heart regular rate no murmurs abdomen is obese without masses. Examination of the right leg are those to open sores. The skin has been lost in these areas. On the left side there are also streaky of present  Results for orders placed in visit on 09/28/12  GLUCOSE, POCT (MANUAL RESULT ENTRY)      Result Value Range   POC Glucose 146 (*) 70 - 99 mg/dl  POCT GLYCOSYLATED HEMOGLOBIN (HGB A1C)      Result Value Range   Hemoglobin A1C 6.2          Assessment & Plan:  Hemoglobin A1c has been slowly rising I suspect secondary to her increasing weight which is up 12 pounds from her last visit. Today she is hesitant to talk with him about bypass and has been to talk with anyone in the nutritional department. She is agreeable to restart her swimming and exercising in a pool.. She has been limited because of the left ankle and left hip pain. We'll go ahead and increase the metformin to twice a day. She was placed on Bactroban ointment and doxycycline because of the open sores on the right side of her leg

## 2012-09-30 ENCOUNTER — Telehealth: Payer: Self-pay

## 2012-09-30 NOTE — Telephone Encounter (Signed)
Pt is calling back about labs. She said if you wanted to you could just send her a letter about her labs Call back number is [phone]

## 2012-10-01 NOTE — Telephone Encounter (Signed)
done

## 2012-10-01 NOTE — Telephone Encounter (Signed)
Labs mailed per her request.

## 2012-10-08 ENCOUNTER — Other Ambulatory Visit: Payer: Self-pay | Admitting: Physician Assistant

## 2012-10-08 NOTE — Telephone Encounter (Signed)
According to Dr Ellis Parents OV note from 09/28/12, he wants pt to start taking metformin BID, but it doesn't look like he wrote a new Rx then. Can someone please send in a new script instead of RFing the incorrect one?

## 2012-10-12 ENCOUNTER — Other Ambulatory Visit: Payer: Self-pay | Admitting: Physician Assistant

## 2012-11-01 ENCOUNTER — Other Ambulatory Visit: Payer: Self-pay | Admitting: Physician Assistant

## 2012-11-24 ENCOUNTER — Other Ambulatory Visit: Payer: Self-pay | Admitting: Physician Assistant

## 2012-12-20 ENCOUNTER — Other Ambulatory Visit: Payer: Self-pay | Admitting: Physician Assistant

## 2013-01-07 ENCOUNTER — Other Ambulatory Visit: Payer: Self-pay | Admitting: Physician Assistant

## 2013-01-11 ENCOUNTER — Ambulatory Visit: Payer: Managed Care, Other (non HMO) | Admitting: Emergency Medicine

## 2013-01-11 ENCOUNTER — Encounter: Payer: Self-pay | Admitting: Emergency Medicine

## 2013-01-11 VITALS — BP 144/92 | HR 95 | Temp 99.0°F | Resp 18 | Ht 64.0 in | Wt 297.0 lb

## 2013-01-11 DIAGNOSIS — Z23 Encounter for immunization: Secondary | ICD-10-CM

## 2013-01-11 DIAGNOSIS — E119 Type 2 diabetes mellitus without complications: Secondary | ICD-10-CM

## 2013-01-11 DIAGNOSIS — I10 Essential (primary) hypertension: Secondary | ICD-10-CM

## 2013-01-11 DIAGNOSIS — E785 Hyperlipidemia, unspecified: Secondary | ICD-10-CM

## 2013-01-11 LAB — COMPREHENSIVE METABOLIC PANEL
ALT: 26 U/L (ref 0–35)
CO2: 26 mEq/L (ref 19–32)
Calcium: 10 mg/dL (ref 8.4–10.5)
Chloride: 104 mEq/L (ref 96–112)
Creat: 0.77 mg/dL (ref 0.50–1.10)
Glucose, Bld: 106 mg/dL — ABNORMAL HIGH (ref 70–99)
Total Protein: 6.7 g/dL (ref 6.0–8.3)

## 2013-01-11 LAB — GLUCOSE, POCT (MANUAL RESULT ENTRY): POC Glucose: 120 mg/dl — AB (ref 70–99)

## 2013-01-11 LAB — LIPID PANEL
Cholesterol: 193 mg/dL (ref 0–200)
HDL: 51 mg/dL (ref >39)
LDL Cholesterol: 103 mg/dL — ABNORMAL HIGH (ref 0–99)
Triglycerides: 194 mg/dL — ABNORMAL HIGH (ref <150)

## 2013-01-11 LAB — POCT GLYCOSYLATED HEMOGLOBIN (HGB A1C): Hemoglobin A1C: 6.2

## 2013-01-11 NOTE — Progress Notes (Signed)
  Subjective:    Patient ID: Tammy Harris, female    DOB: December 17, 1950, 62 y.o.   MRN: [id number]  HPI patient enters for followup of her hypertension hyperlipidemia morbid obesity and diabetes. She states she's been swimming on a regular basis. She has been or can try and lose weight. She is not interested in bypass surgery or more aggressive treatment. She's not interested in seeing a nutritionist.   Review of Systems     Objective:   Physical Exam patient is morbidly obese. Her neck is supple. Her chest was clear. Heart was regular rate without murmurs. Abdomen was obese without tenderness extremity exam reveals no evidence of neuropathy.    Results for orders placed in visit on 01/11/13  GLUCOSE, POCT (MANUAL RESULT ENTRY)      Result Value Range   POC Glucose 120 (*) 70 - 99 mg/dl  POCT GLYCOSYLATED HEMOGLOBIN (HGB A1C)      Result Value Range   Hemoglobin A1C 6.2        Assessment & Plan:  Her diabetes is at goal at 6.2. She needs to continue to focus on her weight loss and exercise on regular basis and I will see her in 3 months for followup

## 2013-01-28 ENCOUNTER — Other Ambulatory Visit: Payer: Self-pay | Admitting: Physician Assistant

## 2013-03-31 ENCOUNTER — Other Ambulatory Visit: Payer: Self-pay | Admitting: Physician Assistant

## 2013-04-10 ENCOUNTER — Other Ambulatory Visit: Payer: Self-pay | Admitting: Physician Assistant

## 2013-04-26 ENCOUNTER — Other Ambulatory Visit: Payer: Self-pay | Admitting: Emergency Medicine

## 2013-05-12 DIAGNOSIS — C801 Malignant (primary) neoplasm, unspecified: Secondary | ICD-10-CM

## 2013-05-12 HISTORY — DX: Malignant (primary) neoplasm, unspecified: C80.1

## 2013-05-26 ENCOUNTER — Other Ambulatory Visit: Payer: Self-pay | Admitting: Emergency Medicine

## 2013-06-08 ENCOUNTER — Other Ambulatory Visit: Payer: Self-pay | Admitting: Emergency Medicine

## 2013-07-05 ENCOUNTER — Other Ambulatory Visit: Payer: Self-pay | Admitting: Emergency Medicine

## 2013-07-06 ENCOUNTER — Telehealth: Payer: Self-pay

## 2013-07-06 MED ORDER — METFORMIN HCL 500 MG PO TABS: ORAL_TABLET | ORAL | Status: DC

## 2013-07-06 MED ORDER — METHOCARBAMOL 500 MG PO TABS: 500.0000 mg | ORAL_TABLET | Freq: Three times a day (TID) | ORAL | Status: DC | PRN

## 2013-07-06 NOTE — Telephone Encounter (Signed)
Patient states her meds were denied.  She has a scheduled appointment with Dr. Everlene Farrier next month.   Needs enough to get her through.   metFORMIN (GLUCOPHAGE) 500 MG tablet  [phone]

## 2013-07-06 NOTE — Telephone Encounter (Signed)
Sent Rxs for 30 days and notified pt on VM.

## 2013-07-06 NOTE — Telephone Encounter (Signed)
Just keep this at 30 day supply for now until I see her for followup here

## 2013-07-06 NOTE — Telephone Encounter (Signed)
Dr Everlene Farrier, I was going to send in a 30 day RF for pt to hold her until her appt 07/19/13, but pt asked if we could send a 90 day supply instead and she also needs methocarbamol.  I have pended both but need your approval for 90 day since she is due for f/up.

## 2013-07-19 ENCOUNTER — Ambulatory Visit: Payer: Managed Care, Other (non HMO)

## 2013-07-19 ENCOUNTER — Ambulatory Visit (INDEPENDENT_AMBULATORY_CARE_PROVIDER_SITE_OTHER): Payer: Managed Care, Other (non HMO) | Admitting: Emergency Medicine

## 2013-07-19 VITALS — BP 127/81 | HR 83 | Temp 98.6°F | Resp 18 | Ht 65.0 in | Wt 293.0 lb

## 2013-07-19 DIAGNOSIS — M542 Cervicalgia: Secondary | ICD-10-CM

## 2013-07-19 DIAGNOSIS — E119 Type 2 diabetes mellitus without complications: Secondary | ICD-10-CM

## 2013-07-19 DIAGNOSIS — I10 Essential (primary) hypertension: Secondary | ICD-10-CM

## 2013-07-19 DIAGNOSIS — E785 Hyperlipidemia, unspecified: Secondary | ICD-10-CM

## 2013-07-19 DIAGNOSIS — E782 Mixed hyperlipidemia: Secondary | ICD-10-CM

## 2013-07-19 LAB — GLUCOSE, POCT (MANUAL RESULT ENTRY): POC Glucose: 132 mg/dl — AB (ref 70–99)

## 2013-07-19 LAB — POCT GLYCOSYLATED HEMOGLOBIN (HGB A1C): Hemoglobin A1C: 6

## 2013-07-19 MED ORDER — METFORMIN HCL 500 MG PO TABS: ORAL_TABLET | ORAL | Status: DC

## 2013-07-19 MED ORDER — METHOCARBAMOL 500 MG PO TABS: ORAL_TABLET | ORAL | Status: DC

## 2013-07-19 NOTE — Patient Instructions (Signed)
Cervical Radiculopathy  Cervical radiculopathy happens when a nerve in the neck is pinched or bruised by a slipped (herniated) disk or by arthritic changes in the bones of the cervical spine. This can occur due to an injury or as part of the normal aging process. Pressure on the cervical nerves can cause pain or numbness that runs from your neck all the way down into your arm and fingers.  CAUSES   There are many possible causes, including:  · Injury.  · Muscle tightness in the neck from overuse.  · Swollen, painful joints (arthritis).  · Breakdown or degeneration in the bones and joints of the spine (spondylosis) due to aging.  · Bone spurs that may develop near the cervical nerves.  SYMPTOMS   Symptoms include pain, weakness, or numbness in the affected arm and hand. Pain can be severe or irritating. Symptoms may be worse when extending or turning the neck.  DIAGNOSIS   Your caregiver will ask about your symptoms and do a physical exam. He or she may test your strength and reflexes. X-rays, CT scans, and MRI scans may be needed in cases of injury or if the symptoms do not go away after a period of time. Electromyography (EMG) or nerve conduction testing may be done to study how your nerves and muscles are working.  TREATMENT   Your caregiver may recommend certain exercises to help relieve your symptoms. Cervical radiculopathy can, and often does, get better with time and treatment. If your problems continue, treatment options may include:  · Wearing a soft collar for short periods of time.  · Physical therapy to strengthen the neck muscles.  · Medicines, such as nonsteroidal anti-inflammatory drugs (NSAIDs), oral corticosteroids, or spinal injections.  · Surgery. Different types of surgery may be done depending on the cause of your problems.  HOME CARE INSTRUCTIONS   · Put ice on the affected area.  · Put ice in a plastic bag.  · Place a towel between your skin and the bag.  · Leave the ice on for 15-20 minutes,  03-04 times a day or as directed by your caregiver.  · If ice does not help, you can try using heat. Take a warm shower or bath, or use a hot water bottle as directed by your caregiver.  · You may try a gentle neck and shoulder massage.  · Use a flat pillow when you sleep.  · Only take over-the-counter or prescription medicines for pain, discomfort, or fever as directed by your caregiver.  · If physical therapy was prescribed, follow your caregiver's directions.  · If a soft collar was prescribed, use it as directed.  SEEK IMMEDIATE MEDICAL CARE IF:   · Your pain gets much worse and cannot be controlled with medicines.  · You have weakness or numbness in your hand, arm, face, or leg.  · You have a high fever or a stiff, rigid neck.  · You lose bowel or bladder control (incontinence).  · You have trouble with walking, balance, or speaking.  MAKE SURE YOU:   · Understand these instructions.  · Will watch your condition.  · Will get help right away if you are not doing well or get worse.  Document Released: 01/21/2001 Document Revised: 07/21/2011 Document Reviewed: 12/10/2010  [employer]® Patient Information ©2014 [employer].

## 2013-07-19 NOTE — Addendum Note (Signed)
Addended by: [name] A on: 07/19/2013 01:35 PM   Modules accepted: Orders

## 2013-07-19 NOTE — Progress Notes (Signed)
Subjective:  This chart was scribed for [name], MD by Mercy Moore, Medial Scribe. This patient was seen in room 23 and the patient's care was started at 12:11 PM.    Patient ID: Tammy Harris, female    DOB: [birth year], 63 y.o.   MRN: [id number]  HPI Tammy Harris is a 63 y.o. female here for a diabetes follow up. Patient reports that she is doing really well though she reports new, intermittent pain in her left shoulder that radiates up to her neck. She reports having a left shoulder x-ray because she had a spur. She reports that the pain is exacerbated by certain movements eg brushing her hair. Patient states that she is under a lot of stress with her family and in-laws in [place] and she feels that this may be worsening her pain.   Patient Active Problem List   Diagnosis Date Noted  . Hypertension 03/30/2012  . Hyperlipidemia 03/30/2012  . Diabetes 03/30/2012   No past medical history on file. No past surgical history on file. Allergies  Allergen Reactions  . Sulfa Antibiotics    Prior to Admission medications   Medication Sig Start Date End Date Taking? Authorizing Provider  aspirin 81 MG tablet Take 81 mg by mouth daily.    Historical Provider, MD  doxycycline (VIBRAMYCIN) 100 MG capsule Take 1 capsule (100 mg total) by mouth 2 (two) times daily. 09/28/12   [name], MD  fluticasone (FLONASE) 50 MCG/ACT nasal spray USE 2 SPRAYS INTO EACH NOSTRIL EVERY DAY 04/10/13   [name], MD  hydrochlorothiazide (HYDRODIURIL) 25 MG tablet TAKE 1 TABLET BY MOUTH EVERY DAY 04/26/13   [name], MD  lisinopril (PRINIVIL,ZESTRIL) 20 MG tablet TAKE 1 TABLET BY MOUTH EVERY DAY 06/08/13   Eleanore Kurtis Bushman, PA-C  loratadine (CLARITIN) 10 MG tablet Take 10 mg by mouth daily.    Historical Provider, MD  metFORMIN (GLUCOPHAGE) 500 MG tablet TAKE 1 TABLET (500 MG TOTAL) BY MOUTH 2 (TWO) TIMES DAILY WITH A MEAL. 07/06/13   [name], MD  methocarbamol (ROBAXIN) 500 MG tablet TAKE  1 TABLET (500 MG TOTAL) BY MOUTH 3 (THREE) TIMES DAILY. 03/31/13   [name], MD  methocarbamol (ROBAXIN) 500 MG tablet Take 1 tablet (500 mg total) by mouth 3 (three) times daily as needed. 07/06/13   [name], MD  mupirocin ointment (BACTROBAN) 2 % Apply topically 3 (three) times daily. 09/28/12   [name], MD  pravastatin (PRAVACHOL) 40 MG tablet TAKE 1 TABLET BY MOUTH EVERY DAY 05/26/13   [name], MD  triamcinolone cream (KENALOG) 0.1 % APPLY TWO TIMES DAILY AS DIRECTED 01/28/13   [name], MD   History   Social History  . Marital Status: Unknown    Spouse Name: N/A    Number of Children: N/A  . Years of Education: N/A   Occupational History  . Not on file.   Social History Main Topics  . Smoking status: Never Smoker   . Smokeless tobacco: Not on file  . Alcohol Use: Not on file  . Drug Use: Not on file  . Sexual Activity: Not on file   Other Topics Concern  . Not on file   Social History Narrative  . No narrative on file       Review of Systems  Musculoskeletal: Positive for neck pain.       Objective:   Physical Exam  CONSTITUTIONAL: Well developed/well  nourished HEAD: Normocephalic/atraumatic EYES: EOMI/PERRL ENMT: Mucous membranes moist NECK:  Tenderness to left trapezius muscle; Pain with extension of the neck  SPINE: entire spine nontender CV: S1/S2 noted, no murmurs/rubs/gallops noted LUNGS: Lungs are clear to auscultation bilaterally, no apparent distress ABDOMEN: soft, nontender, no rebound or guarding GU: no cva tenderness NEURO: Pt is awake/alert, moves all extremitiesx4 EXTREMITIES: pulses normal, full ROM; Reflexes and strength normal in the left arm SKIN: warm, color normal PSYCH: no abnormalities of mood noted  Filed Vitals:   07/19/13 1159  BP: 127/81  Pulse: 83  Temp: 98.6 F (37 C)  Resp: 18  Height: 5\' 5"  (1.651 m)  Weight: 293 lb (132.904 kg)  SpO2: 98%    Results for orders placed in visit on 01/11/13    LIPID PANEL      Result Value Ref Range   Cholesterol 193  0 - 200 mg/dL   Triglycerides 194 (*) <150 mg/dL   HDL 51  >39 mg/dL   Total CHOL/HDL Ratio 3.8     VLDL 39  0 - 40 mg/dL   LDL Cholesterol 103 (*) 0 - 99 mg/dL  COMPREHENSIVE METABOLIC PANEL      Result Value Ref Range   Sodium 142  135 - 145 mEq/L   Potassium 4.1  3.5 - 5.3 mEq/L   Chloride 104  96 - 112 mEq/L   CO2 26  19 - 32 mEq/L   Glucose, Bld 106 (*) 70 - 99 mg/dL   BUN 20  6 - 23 mg/dL   Creat 0.77  0.50 - 1.10 mg/dL   Total Bilirubin 0.4  0.3 - 1.2 mg/dL   Alkaline Phosphatase 54  39 - 117 U/L   AST 17  0 - 37 U/L   ALT 26  0 - 35 U/L   Total Protein 6.7  6.0 - 8.3 g/dL   Albumin 4.1  3.5 - 5.2 g/dL   Calcium 10.0  8.4 - 10.5 mg/dL  GLUCOSE, POCT (MANUAL RESULT ENTRY)      Result Value Ref Range   POC Glucose 120 (*) 70 - 99 mg/dl  POCT GLYCOSYLATED HEMOGLOBIN (HGB A1C)      Result Value Ref Range   Hemoglobin A1C 6.2     Results for orders placed in visit on 07/19/13  GLUCOSE, POCT (MANUAL RESULT ENTRY)      Result Value Ref Range   POC Glucose 132 (*) 70 - 99 mg/dl  POCT GLYCOSYLATED HEMOGLOBIN (HGB A1C)      Result Value Ref Range   Hemoglobin A1C 6.0     [facility] reading (PRIMARY) by  Dr.Aveena Bari patient has severe arthritic change with significant degenerative disc disease C5-C6     Assessment & Plan:   Weight is still an issue. Her diabetes is under good control. She has a radiculopathy down her left arm. She will call me with the name of the physician she is seen in the past for her back. Once I have the name I will make the referral

## 2013-07-20 ENCOUNTER — Telehealth: Payer: Self-pay

## 2013-07-20 LAB — COMPLETE METABOLIC PANEL WITH GFR
ALT: 30 U/L (ref 0–35)
AST: 21 U/L (ref 0–37)
Albumin: 4.4 g/dL (ref 3.5–5.2)
Alkaline Phosphatase: 54 U/L (ref 39–117)
BILIRUBIN TOTAL: 0.5 mg/dL (ref 0.2–1.2)
BUN: 19 mg/dL (ref 6–23)
CO2: 28 mEq/L (ref 19–32)
CREATININE: 0.81 mg/dL (ref 0.50–1.10)
Calcium: 9.9 mg/dL (ref 8.4–10.5)
Chloride: 101 mEq/L (ref 96–112)
GFR, EST NON AFRICAN AMERICAN: 79 mL/min
GLUCOSE: 131 mg/dL — AB (ref 70–99)
Potassium: 4.4 mEq/L (ref 3.5–5.3)
Sodium: 140 mEq/L (ref 135–145)
Total Protein: 6.7 g/dL (ref 6.0–8.3)

## 2013-07-20 LAB — LIPID PANEL
CHOLESTEROL: 179 mg/dL (ref 0–200)
HDL: 52 mg/dL (ref >39)
LDL Cholesterol: 92 mg/dL (ref 0–99)
TRIGLYCERIDES: 177 mg/dL — AB (ref <150)
Total CHOL/HDL Ratio: 3.4 Ratio
VLDL: 35 mg/dL (ref 0–40)

## 2013-07-20 NOTE — Telephone Encounter (Signed)
Patient is calling to let dr Everlene Farrier know that the doctor she wants to see is dr Vertell Limber

## 2013-07-21 ENCOUNTER — Other Ambulatory Visit: Payer: Self-pay | Admitting: Emergency Medicine

## 2013-07-21 DIAGNOSIS — M5010 Cervical disc disorder with radiculopathy, unspecified cervical region: Secondary | ICD-10-CM

## 2013-07-21 NOTE — Telephone Encounter (Signed)
Please be sure there is a referral in  the system for  Patient to see Dr. Vertell Limber for a cervical radiculopathy.

## 2013-07-22 ENCOUNTER — Telehealth: Payer: Self-pay

## 2013-07-22 MED ORDER — METFORMIN HCL 500 MG PO TABS: ORAL_TABLET | ORAL | Status: DC

## 2013-07-22 NOTE — Telephone Encounter (Signed)
Pharm sent req to change Rx to 90 day supply. I noticed that sig and # do not match. Called and LMOM for pt to CB and verify whether she takes 1 tab BID or 2 tabs BID.

## 2013-07-22 NOTE — Telephone Encounter (Signed)
Pt verified that she just takes 1 tablet BID. Sent in 90 day supply

## 2013-07-29 ENCOUNTER — Other Ambulatory Visit: Payer: Self-pay | Admitting: Emergency Medicine

## 2013-08-24 ENCOUNTER — Other Ambulatory Visit: Payer: Self-pay | Admitting: Emergency Medicine

## 2013-09-14 ENCOUNTER — Other Ambulatory Visit: Payer: Self-pay | Admitting: Physician Assistant

## 2013-10-13 ENCOUNTER — Other Ambulatory Visit: Payer: Self-pay | Admitting: Emergency Medicine

## 2013-10-14 NOTE — Telephone Encounter (Signed)
Dr Everlene Farrier, you see this pt regularly but don't see this med addressed recently. OK to RF?

## 2013-10-24 ENCOUNTER — Other Ambulatory Visit: Payer: Self-pay | Admitting: Emergency Medicine

## 2013-10-24 ENCOUNTER — Other Ambulatory Visit: Payer: Self-pay | Admitting: Physician Assistant

## 2013-11-21 ENCOUNTER — Other Ambulatory Visit: Payer: Self-pay | Admitting: Emergency Medicine

## 2013-11-22 ENCOUNTER — Ambulatory Visit: Payer: Managed Care, Other (non HMO) | Admitting: Emergency Medicine

## 2013-12-13 ENCOUNTER — Other Ambulatory Visit: Payer: Self-pay | Admitting: Physician Assistant

## 2013-12-20 ENCOUNTER — Ambulatory Visit (INDEPENDENT_AMBULATORY_CARE_PROVIDER_SITE_OTHER): Payer: Managed Care, Other (non HMO) | Admitting: Emergency Medicine

## 2013-12-20 VITALS — BP 152/83 | HR 86 | Temp 98.4°F | Resp 16 | Ht 64.5 in | Wt 287.0 lb

## 2013-12-20 DIAGNOSIS — E119 Type 2 diabetes mellitus without complications: Secondary | ICD-10-CM

## 2013-12-20 DIAGNOSIS — R21 Rash and other nonspecific skin eruption: Secondary | ICD-10-CM

## 2013-12-20 DIAGNOSIS — I10 Essential (primary) hypertension: Secondary | ICD-10-CM

## 2013-12-20 DIAGNOSIS — Z23 Encounter for immunization: Secondary | ICD-10-CM

## 2013-12-20 LAB — POCT SKIN KOH: SKIN KOH, POC: POSITIVE

## 2013-12-20 LAB — GLUCOSE, POCT (MANUAL RESULT ENTRY): POC Glucose: 138 mg/dl — AB (ref 70–99)

## 2013-12-20 LAB — POCT GLYCOSYLATED HEMOGLOBIN (HGB A1C): Hemoglobin A1C: 6

## 2013-12-20 MED ORDER — LOSARTAN POTASSIUM 100 MG PO TABS: 100.0000 mg | ORAL_TABLET | Freq: Every day | ORAL | Status: DC

## 2013-12-20 MED ORDER — FLUCONAZOLE 150 MG PO TABS: 150.0000 mg | ORAL_TABLET | Freq: Once | ORAL | Status: DC

## 2013-12-20 NOTE — Progress Notes (Signed)
Subjective:    Patient ID: Tammy Harris, female    DOB: Jul 09, 1950, 63 y.o.   MRN: [id number] This chart was scribed for [name], MD by Cathie Hoops, ED Scribe. The patient was seen in Room 21. The patient's care was started at 9:45 AM.   HPI HPI Comments: Tammy Harris is a 63 y.o. female who presents to the [facility] for a diabetes recheck. Pt reports she has previously diagnosed morning crud. Pt reports she takes Lisinopril every morning when her cough starts. Pt reports she coughs during the day. Pt also reports a rash underneath her breasts and between her legs. Pt reports using topical ointment with relief, but pt doesn't like the feeling of the ointment on her skin.  Pt reports her BP is increased with family stress. Pt denies chest pain, SOB, nausea, vomiting, fever or chills.  Immunizations UTD  She is morbidly obese. She has scars over both knees. She has a red, scaly, hyperpigmented under surface of both breasts.  Review of Systems  Constitutional: Negative for fever and chills.  Respiratory: Positive for cough. Negative for shortness of breath.   Gastrointestinal: Negative for nausea and vomiting.  Skin: Positive for rash (underneath breasts and between legs).  Neurological: Negative for weakness.   No past medical history on file. No past surgical history on file. Allergies  Allergen Reactions  . Sulfa Antibiotics    Current Outpatient Prescriptions  Medication Sig Dispense Refill  . aspirin 81 MG tablet Take 81 mg by mouth daily.      . fluticasone (FLONASE) 50 MCG/ACT nasal spray USE 2 SPRAYS INTO EACH NOSTRIL EVERY DAY  48 g  2  . hydrochlorothiazide (HYDRODIURIL) 25 MG tablet TAKE 1 TABLET BY MOUTH EVERY DAY  90 tablet  0  . lisinopril (PRINIVIL,ZESTRIL) 20 MG tablet TAKE 1 TABLET BY MOUTH EVERY DAY  90 tablet  0  . metFORMIN (GLUCOPHAGE) 500 MG tablet TAKE 1 TABLET (500 MG TOTAL) BY MOUTH 2 (TWO) TIMES DAILY WITH A MEAL.  180  tablet  1  . methocarbamol (ROBAXIN) 500 MG tablet TAKE 1 TABLET (500 MG TOTAL) BY MOUTH 3 (THREE) TIMES DAILY.  270 tablet  0  . pravastatin (PRAVACHOL) 40 MG tablet TAKE 1 TABLET BY MOUTH EVERY DAY  90 tablet  0  . triamcinolone cream (KENALOG) 0.1 % APPLY TWO TIMES DAILY AS DIRECTED  454 g  2   No current facility-administered medications for this visit.   Objective:   Triage Vitals: BP 152/83  Pulse 86  Temp(Src) 98.4 F (36.9 C)  Resp 16  Ht 5' 4.5" (1.638 m)  Wt 287 lb (130.182 kg)  BMI 48.52 kg/m2  SpO2 95% Physical Exam CONSTITUTIONAL: Well developed/well nourished HEAD: Normocephalic/atraumatic EYES: EOMI/PERRL ENMT: Mucous membranes moist NECK: supple no meningeal signs SPINE:entire spine nontender CV: S1/S2 noted, no murmurs/rubs/gallops noted LUNGS: Lungs are clear to auscultation bilaterally, no apparent distress ABDOMEN: soft, nontender, no rebound or guarding GU:no cva tenderness NEURO: Pt is awake/alert, moves all extremitiesx4 EXTREMITIES: pulses normal, full ROM SKIN: warm, color normal. She has a red, scaly, hyperpigmented under surface of both breasts. PSYCH: no abnormalities of mood noted KOH Pos hyphae  Results for orders placed in visit on 12/20/13  POCT SKIN KOH      Result Value Ref Range   Skin KOH, POC Positive    GLUCOSE, POCT (MANUAL RESULT ENTRY)      Result Value Ref Range   POC Glucose  138 (*) 70 - 99 mg/dl  POCT GLYCOSYLATED HEMOGLOBIN (HGB A1C)      Result Value Ref Range   Hemoglobin A1C 6.0     Assessment & Plan:  9:55 AM- Patient informed of current plan for treatment and evaluation and agrees with plan at this time. We'll stop her ACE inhibitor and changed to losartan 100 mg. She was given Diflucan for yeast infection I personally performed the services described in this documentation, which was scribed in my presence. The recorded information has been reviewed and is accurate.

## 2013-12-20 NOTE — Addendum Note (Signed)
Addended by: Lanna Poche R on: 12/20/2013 01:04 PM   Modules accepted: Orders

## 2013-12-20 NOTE — Patient Instructions (Addendum)
Hypertension Hypertension, commonly called high blood pressure, is when the force of blood pumping through your arteries is too strong. Your arteries are the blood vessels that carry blood from your heart throughout your body. A blood pressure reading consists of a higher number over a lower number, such as 110/72. The higher number (systolic) is the pressure inside your arteries when your heart pumps. The lower number (diastolic) is the pressure inside your arteries when your heart relaxes. Ideally you want your blood pressure below 120/80. Hypertension forces your heart to work harder to pump blood. Your arteries may become narrow or stiff. Having hypertension puts you at risk for heart disease, stroke, and other problems.  RISK FACTORS Some risk factors for high blood pressure are controllable. Others are not.  Risk factors you cannot control include:   Race. You may be at higher risk if you are African American.  Age. Risk increases with age.  Gender. Men are at higher risk than women before age 45 years. After age 65, women are at higher risk than men. Risk factors you can control include:  Not getting enough exercise or physical activity.  Being overweight.  Getting too much fat, sugar, calories, or salt in your diet.  Drinking too much alcohol. SIGNS AND SYMPTOMS Hypertension does not usually cause signs or symptoms. Extremely high blood pressure (hypertensive crisis) may cause headache, anxiety, shortness of breath, and nosebleed. DIAGNOSIS  To check if you have hypertension, your health care provider will measure your blood pressure while you are seated, with your arm held at the level of your heart. It should be measured at least twice using the same arm. Certain conditions can cause a difference in blood pressure between your right and left arms. A blood pressure reading that is higher than normal on one occasion does not mean that you need treatment. If one blood pressure reading  is high, ask your health care provider about having it checked again. TREATMENT  Treating high blood pressure includes making lifestyle changes and possibly taking medicine. Living a healthy lifestyle can help lower high blood pressure. You may need to change some of your habits. Lifestyle changes may include:  Following the DASH diet. This diet is high in fruits, vegetables, and whole grains. It is low in salt, red meat, and added sugars.  Getting at least 2 hours of brisk physical activity every week.  Losing weight if necessary.  Not smoking.  Limiting alcoholic beverages.  Learning ways to reduce stress. If lifestyle changes are not enough to get your blood pressure under control, your health care provider may prescribe medicine. You may need to take more than one. Work closely with your health care provider to understand the risks and benefits. HOME CARE INSTRUCTIONS  Have your blood pressure rechecked as directed by your health care provider.   Take medicines only as directed by your health care provider. Follow the directions carefully. Blood pressure medicines must be taken as prescribed. The medicine does not work as well when you skip doses. Skipping doses also puts you at risk for problems.   Do not smoke.   Monitor your blood pressure at home as directed by your health care provider. SEEK MEDICAL CARE IF:   You think you are having a reaction to medicines taken.  You have recurrent headaches or feel dizzy.  You have swelling in your ankles.  You have trouble with your vision. SEEK IMMEDIATE MEDICAL CARE IF:  You develop a severe headache or confusion.    You have unusual weakness, numbness, or feel faint.  You have severe chest or abdominal pain.  You vomit repeatedly.  You have trouble breathing. MAKE SURE YOU:   Understand these instructions.  Will watch your condition.  Will get help right away if you are not doing well or get worse. Document  Released: 04/28/2005 Document Revised: 09/12/2013 Document Reviewed: 02/18/2013 [facility] Patient Information 2015 [place], [place]. This information is not intended to replace advice given to you by your health care provider. Make sure you discuss any questions you have with your health care provider. Yeast Infection of the Skin Some yeast on the skin is normal, but sometimes it causes an infection. If you have a yeast infection, it shows up as white or light brown patches on brown skin. You can see it better in the summer on tan skin. It causes light-colored holes in your suntan. It can happen on any area of the body. This cannot be passed from person to person. HOME CARE  Scrub your skin daily with a dandruff shampoo. Your rash may take a couple weeks to get well.  Do not scratch or itch the rash. GET HELP RIGHT AWAY IF:   You get another infection from scratching. The skin may get warm, red, and may ooze fluid.  The infection does not seem to be getting better. MAKE SURE YOU:  Understand these instructions.  Will watch your condition.  Will get help right away if you are not doing well or get worse. Document Released: 04/10/2008 Document Revised: 07/21/2011 Document Reviewed: 04/10/2008 [facility] Patient Information 2015 [place], [place]. This information is not intended to replace advice given to you by your health care provider. Make sure you discuss any questions you have with your health care provider.

## 2013-12-24 ENCOUNTER — Telehealth: Payer: Self-pay | Admitting: Emergency Medicine

## 2013-12-24 NOTE — Telephone Encounter (Signed)
Patient states that her yeast infection has returned since taking 1 pill of her Diflucan. She wants to know if it is ok to take the 2nd pill.   [phone]

## 2013-12-25 NOTE — Telephone Encounter (Signed)
Left on machine that it is ok to go ahead and take 2nd dose.

## 2013-12-28 ENCOUNTER — Encounter: Payer: Self-pay | Admitting: Emergency Medicine

## 2013-12-29 ENCOUNTER — Other Ambulatory Visit: Payer: Self-pay | Admitting: Emergency Medicine

## 2013-12-29 NOTE — Telephone Encounter (Signed)
Dr Everlene Farrier, do you want to RF or pt to RTC? See phone message 12/24/13 also.

## 2014-01-18 ENCOUNTER — Other Ambulatory Visit: Payer: Self-pay | Admitting: Emergency Medicine

## 2014-01-20 ENCOUNTER — Other Ambulatory Visit: Payer: Self-pay | Admitting: Emergency Medicine

## 2014-01-24 ENCOUNTER — Telehealth: Payer: Self-pay | Admitting: Radiology

## 2014-01-24 DIAGNOSIS — R21 Rash and other nonspecific skin eruption: Secondary | ICD-10-CM

## 2014-01-24 MED ORDER — FLUCONAZOLE 150 MG PO TABS: ORAL_TABLET | ORAL | Status: DC

## 2014-01-24 NOTE — Telephone Encounter (Signed)
Patient walked in for refill of diflucan, Dr Everlene Farrier advised okay to refill refer to derm, if well after using the diflucan patient may cancel derm

## 2014-02-18 ENCOUNTER — Other Ambulatory Visit: Payer: Self-pay | Admitting: Physician Assistant

## 2014-04-18 ENCOUNTER — Encounter: Payer: Self-pay | Admitting: Emergency Medicine

## 2014-04-18 ENCOUNTER — Ambulatory Visit (INDEPENDENT_AMBULATORY_CARE_PROVIDER_SITE_OTHER): Payer: Managed Care, Other (non HMO) | Admitting: Emergency Medicine

## 2014-04-18 VITALS — BP 143/81 | HR 87 | Temp 98.7°F | Resp 16 | Ht 65.5 in | Wt 287.0 lb

## 2014-04-18 DIAGNOSIS — E782 Mixed hyperlipidemia: Secondary | ICD-10-CM

## 2014-04-18 DIAGNOSIS — E119 Type 2 diabetes mellitus without complications: Secondary | ICD-10-CM

## 2014-04-18 DIAGNOSIS — I10 Essential (primary) hypertension: Secondary | ICD-10-CM

## 2014-04-18 LAB — BASIC METABOLIC PANEL WITH GFR
BUN: 17 mg/dL (ref 6–23)
CHLORIDE: 104 meq/L (ref 96–112)
CO2: 27 mEq/L (ref 19–32)
Calcium: 10.3 mg/dL (ref 8.4–10.5)
Creat: 0.84 mg/dL (ref 0.50–1.10)
GFR, EST NON AFRICAN AMERICAN: 75 mL/min
GFR, Est African American: 87 mL/min
Glucose, Bld: 125 mg/dL — ABNORMAL HIGH (ref 70–99)
Potassium: 4.5 mEq/L (ref 3.5–5.3)
SODIUM: 141 meq/L (ref 135–145)

## 2014-04-18 LAB — LIPID PANEL
Cholesterol: 194 mg/dL (ref 0–200)
HDL: 48 mg/dL (ref >39)
LDL Cholesterol: 105 mg/dL — ABNORMAL HIGH (ref 0–99)
Total CHOL/HDL Ratio: 4 Ratio
Triglycerides: 203 mg/dL — ABNORMAL HIGH (ref <150)
VLDL: 41 mg/dL — ABNORMAL HIGH (ref 0–40)

## 2014-04-18 LAB — GLUCOSE, POCT (MANUAL RESULT ENTRY): POC GLUCOSE: 127 mg/dL — AB (ref 70–99)

## 2014-04-18 LAB — POCT GLYCOSYLATED HEMOGLOBIN (HGB A1C): Hemoglobin A1C: 6.3

## 2014-04-18 MED ORDER — ZOSTER VACCINE LIVE 19400 UNT/0.65ML ~~LOC~~ SOLR: 0.6500 mL | Freq: Once | SUBCUTANEOUS | Status: DC

## 2014-04-18 NOTE — Progress Notes (Addendum)
   Subjective:  This chart was scribed for Tammy Queen, MD by Tammy Harris, Medical Scribe. This patient was seen in Room 21 and the patient's care was started at 11:14 AM.    Patient ID: Tammy Harris, female    DOB: 10-Sep-1950, 63 y.o.   MRN: [id number]  HPI HPI Comments: Siriah Treat is a 63 y.o. female who presents to the [facility] for a 4 month DM follow-up.  She has already received her flu shot this year and she would like to receive her Shingle's vaccine and Prevnar.  She has gotten her colonoscopy within the last 10 years.  She does not have a family history of colon cancer.  She does not see an ophthalmologist regularly.  She takes Glucophage twice daily.     No past medical history on file. No past surgical history on file. No family history on file. History   Social History  . Marital Status: Unknown    Spouse Name: N/A    Number of Children: N/A  . Years of Education: N/A   Occupational History  . Not on file.   Social History Main Topics  . Smoking status: Never Smoker   . Smokeless tobacco: Not on file  . Alcohol Use: Not on file  . Drug Use: Not on file  . Sexual Activity: Not on file   Other Topics Concern  . Not on file   Social History Narrative   Allergies  Allergen Reactions  . Ace Inhibitors     Patient developed a cough with use of ACE inhibitors.  . Sulfa Antibiotics     Review of Systems   Objective:  Physical Exam  Constitutional: She is oriented to person, place, and time. She appears well-developed and well-nourished.  Morbidly obese.  HENT:  Head: Normocephalic and atraumatic.  Eyes: EOM are normal.  Neck: Normal range of motion. Carotid bruit is not present.  Cardiovascular: Normal rate, regular rhythm and normal heart sounds.   No murmur heard. Pulmonary/Chest: Effort normal and breath sounds normal. No respiratory distress. She has no wheezes. She has no rales.  Musculoskeletal: Normal range of motion.    Neurological: She is alert and oriented to person, place, and time.  Skin: Skin is warm and dry.  A few red bumps on her chin that appear to look like shave burns.  Psychiatric: She has a normal mood and affect. Her behavior is normal.  Nursing note and vitals reviewed.  Results for orders placed or performed in visit on 04/18/14  POCT glucose (manual entry)  Result Value Ref Range   POC Glucose 127 (A) 70 - 99 mg/dl  POCT glycosylated hemoglobin (Hb A1C)  Result Value Ref Range   Hemoglobin A1C 6.3     BP 143/81 mmHg  Pulse 87  Temp(Src) 98.7 F (37.1 C) (Oral)  Resp 16  Ht 5' 5.5" (1.664 m)  Wt 287 lb (130.182 kg)  BMI 47.02 kg/m2  SpO2 97% Assessment & Plan:  Routine labs done for follow-up of high cholesterol and DM. I personally performed the services described in this documentation, which was scribed in my presence. The recorded information has been reviewed and is accurate. She was given a prescription for the shingles vaccine. I encouraged her to exercise but she has been limited due to orthopedic issues.

## 2014-05-14 ENCOUNTER — Other Ambulatory Visit: Payer: Self-pay | Admitting: Physician Assistant

## 2014-07-13 ENCOUNTER — Other Ambulatory Visit: Payer: Self-pay | Admitting: Emergency Medicine

## 2014-07-15 ENCOUNTER — Other Ambulatory Visit: Payer: Self-pay | Admitting: Emergency Medicine

## 2014-08-03 ENCOUNTER — Other Ambulatory Visit: Payer: Self-pay

## 2014-08-03 NOTE — Telephone Encounter (Signed)
Dr Everlene Farrier, pt was in for check up in Dec, but I don't see any recent discussions about  AR or this med. Can we give RFs? Pt must get 90 day RFs per ins, which is how I have pended it.

## 2014-08-04 MED ORDER — FLUTICASONE PROPIONATE 50 MCG/ACT NA SUSP: NASAL | Status: AC

## 2014-08-15 ENCOUNTER — Ambulatory Visit (INDEPENDENT_AMBULATORY_CARE_PROVIDER_SITE_OTHER): Payer: 59 | Admitting: Emergency Medicine

## 2014-08-15 ENCOUNTER — Encounter: Payer: Self-pay | Admitting: Emergency Medicine

## 2014-08-15 ENCOUNTER — Other Ambulatory Visit: Payer: Self-pay | Admitting: Emergency Medicine

## 2014-08-15 VITALS — BP 166/91 | HR 81 | Temp 97.6°F | Resp 16 | Ht 64.0 in | Wt 292.0 lb

## 2014-08-15 DIAGNOSIS — E119 Type 2 diabetes mellitus without complications: Secondary | ICD-10-CM

## 2014-08-15 DIAGNOSIS — Z23 Encounter for immunization: Secondary | ICD-10-CM | POA: Diagnosis not present

## 2014-08-15 DIAGNOSIS — E782 Mixed hyperlipidemia: Secondary | ICD-10-CM | POA: Diagnosis not present

## 2014-08-15 DIAGNOSIS — I10 Essential (primary) hypertension: Secondary | ICD-10-CM | POA: Diagnosis not present

## 2014-08-15 LAB — CBC WITH DIFFERENTIAL/PLATELET
BASOS PCT: 1 % (ref 0–1)
Basophils Absolute: 0.1 10*3/uL (ref 0.0–0.1)
Eosinophils Absolute: 0.5 10*3/uL (ref 0.0–0.7)
Eosinophils Relative: 5 % (ref 0–5)
HEMATOCRIT: 44.3 % (ref 36.0–46.0)
HEMOGLOBIN: 14.8 g/dL (ref 12.0–15.0)
Lymphocytes Relative: 27 % (ref 12–46)
Lymphs Abs: 2.5 10*3/uL (ref 0.7–4.0)
MCH: 30.1 pg (ref 26.0–34.0)
MCHC: 33.4 g/dL (ref 30.0–36.0)
MCV: 90.2 fL (ref 78.0–100.0)
MONO ABS: 0.7 10*3/uL (ref 0.1–1.0)
MPV: 11.5 fL (ref 8.6–12.4)
Monocytes Relative: 8 % (ref 3–12)
NEUTROS PCT: 59 % (ref 43–77)
Neutro Abs: 5.4 10*3/uL (ref 1.7–7.7)
PLATELETS: 284 10*3/uL (ref 150–400)
RBC: 4.91 MIL/uL (ref 3.87–5.11)
RDW: 13.2 % (ref 11.5–15.5)
WBC: 9.2 10*3/uL (ref 4.0–10.5)

## 2014-08-15 LAB — HEMOGLOBIN A1C
HEMOGLOBIN A1C: 6.8 % — AB (ref <5.7)
MEAN PLASMA GLUCOSE: 148 mg/dL — AB (ref <117)

## 2014-08-15 LAB — COMPLETE METABOLIC PANEL WITH GFR
ALT: 56 U/L — ABNORMAL HIGH (ref 0–35)
AST: 41 U/L — ABNORMAL HIGH (ref 0–37)
Albumin: 4.1 g/dL (ref 3.5–5.2)
Alkaline Phosphatase: 61 U/L (ref 39–117)
BUN: 18 mg/dL (ref 6–23)
CALCIUM: 9.6 mg/dL (ref 8.4–10.5)
CHLORIDE: 102 meq/L (ref 96–112)
CO2: 26 meq/L (ref 19–32)
CREATININE: 0.82 mg/dL (ref 0.50–1.10)
GFR, EST AFRICAN AMERICAN: 89 mL/min
GFR, Est Non African American: 77 mL/min
Glucose, Bld: 135 mg/dL — ABNORMAL HIGH (ref 70–99)
Potassium: 4.5 mEq/L (ref 3.5–5.3)
Sodium: 140 mEq/L (ref 135–145)
Total Bilirubin: 0.5 mg/dL (ref 0.2–1.2)
Total Protein: 6.6 g/dL (ref 6.0–8.3)

## 2014-08-15 LAB — LIPID PANEL
Cholesterol: 168 mg/dL (ref 0–200)
HDL: 51 mg/dL (ref >=46)
LDL CALC: 86 mg/dL (ref 0–99)
Total CHOL/HDL Ratio: 3.3 Ratio
Triglycerides: 153 mg/dL — ABNORMAL HIGH (ref <150)
VLDL: 31 mg/dL (ref 0–40)

## 2014-08-15 LAB — MICROALBUMIN, URINE: Microalb, Ur: 2 mg/dL (ref <2.0)

## 2014-08-15 MED ORDER — AMLODIPINE BESYLATE 5 MG PO TABS: 5.0000 mg | ORAL_TABLET | Freq: Every day | ORAL | Status: DC

## 2014-08-15 MED ORDER — ZOSTER VACCINE LIVE 19400 UNT/0.65ML ~~LOC~~ SOLR: 0.6500 mL | Freq: Once | SUBCUTANEOUS | Status: DC

## 2014-08-15 NOTE — Progress Notes (Addendum)
   Subjective:  This chart was scribe for Tammy Russian, MD by [name], ED Scribe. The patient was seen in Room 21 and the patient's care was started at 11:19 AM.    Patient ID: Tammy Harris, female    DOB: 12/15/1950, 64 y.o.   MRN: [id number] Chief Complaint  Patient presents with  . Follow-up  . Diabetes    HPI HPI Comments: Tammy Harris is a 64 y.o. female with a hx of HTN, HLD, and Diabetes who presents to the [facility] for a follow up with her diabetes. She reports that she has been feeling well but she does not check her blood sugar daily. She denies numbness and pain in her feet. She reports having the "morning crude" and she uses her Flonase daily. She denies receiving the Prevnar vaccine.    No past medical history on file.   No past surgical history on file.   Allergies  Allergen Reactions  . Ace Inhibitors     Patient developed a cough with use of ACE inhibitors.  . Sulfa Antibiotics      Review of Systems  Constitutional: Negative for fever and chills.  Musculoskeletal: Negative for myalgias, joint swelling and arthralgias.  Skin: Negative for rash.  Neurological: Negative for dizziness, light-headedness, numbness and headaches.       Objective:   Physical Exam  Vitals reviewed.  CONSTITUTIONAL: Well developed/well nourished. Morbidly Obese. HEAD: Normocephalic/atraumatic EYES: EOMI/PERRL ENMT: Mucous membranes moist NECK: supple no meningeal signs SPINE/BACK:entire spine nontender CV: S1/S2 noted, no murmurs/rubs/gallops noted LUNGS: Lungs are clear to auscultation bilaterally, no apparent distress ABDOMEN: soft, nontender, no rebound or guarding, bowel sounds noted throughout abdomen GU:no cva tenderness NEURO: Pt is awake/alert/appropriate, moves all extremitiesx4.  No facial droop.   EXTREMITIES: pulses normal/equal, full ROM SKIN: warm, color normal PSYCH: no abnormalities of mood noted, alert and oriented to  situation      Assessment & Plan:  Pneumococcal 23 vaccine was ordered however patient was inadvertently given Prevnar 13. Patient will be advised of this. I did add Norvasc 5 mg 1 a day because blood pressure is not at goal. She has been on flaxseed oil and we'll see if that has made any difference with her triglycerides.I personally performed the services described in this documentation, which was scribed in my presence. The recorded information has been reviewed and is accurate.

## 2014-08-17 ENCOUNTER — Telehealth: Payer: Self-pay

## 2014-08-17 NOTE — Telephone Encounter (Signed)
Pt returning call about lab results. I gave her results.

## 2014-08-17 NOTE — Telephone Encounter (Signed)
Patient is returning a missed phone call from lab. Please call back! [phone]

## 2014-08-18 LAB — HEPATITIS C ANTIBODY: HCV Ab: NEGATIVE

## 2014-08-22 ENCOUNTER — Other Ambulatory Visit: Payer: Self-pay

## 2014-08-22 MED ORDER — METHOCARBAMOL 500 MG PO TABS: ORAL_TABLET | ORAL | Status: DC

## 2014-08-22 NOTE — Telephone Encounter (Signed)
Pharm sent req for Rf of methocarbamol. Dr Everlene Farrier, you just saw pt last week for check up, but don't see this med discussed. Do you want to RF?

## 2014-10-17 ENCOUNTER — Encounter: Payer: Self-pay | Admitting: *Deleted

## 2014-10-31 ENCOUNTER — Other Ambulatory Visit: Payer: Self-pay | Admitting: Emergency Medicine

## 2014-10-31 ENCOUNTER — Telehealth: Payer: Self-pay | Admitting: Emergency Medicine

## 2014-10-31 DIAGNOSIS — N938 Other specified abnormal uterine and vaginal bleeding: Secondary | ICD-10-CM

## 2014-10-31 NOTE — Telephone Encounter (Signed)
Left message for pt to call back  °

## 2014-10-31 NOTE — Telephone Encounter (Signed)
I think that appointment is fine. I would be happy to see her sooner and check her hemoglobin and examine her. I will be here tomorrow from 8 until 1. I will be here Saturday from 8 until 12 and Monday 8 until 1

## 2014-10-31 NOTE — Telephone Encounter (Signed)
Spoke with pt, she states her appt is not until 11/08/2014. Our office just called her. I believe this is too far out. What do you think Dr. Everlene Farrier?

## 2014-10-31 NOTE — Telephone Encounter (Signed)
Spoke with pt, she is bleeding from her vagina. It is not really heavy, but with cramping and sharp pain. She has not had a period for quite some time. I advised pt she most likely she would have to come in. Please advise.

## 2014-10-31 NOTE — Telephone Encounter (Signed)
Patient states that she has been bleeding for the past week. She probably needs an OV but she wants Dr. Everlene Farrier to call her first.  [phone]

## 2014-10-31 NOTE — Telephone Encounter (Signed)
I put in an urgent referral for patient to be seen by GYN. Please check on this tomorrow Wednesday 11/01/14

## 2014-11-01 NOTE — Telephone Encounter (Signed)
Per Dr. Everlene Farrier call patient and see if she wants to f/u with him tomorrow Raynelle Dick 6/23) since she is not scheduled to see Dr. Dellis Filbert until 6/29. Left message to return call. Her husband returned call earlier this am and stated she was resting and he would talk with her and see if she wanted to see Dr. Everlene Farrier tomorrow. I have not heard back from her or her husband so I left message again to return call. If she wants to f/u with him tomorrow add appt to the end of his clinic tomorrow Thursday 6/23.

## 2014-11-01 NOTE — Telephone Encounter (Signed)
Left message for pt to call back  °

## 2014-11-02 NOTE — Telephone Encounter (Signed)
Spoke with pt, she states she is on the cancellation list at [facility] but still has an appt locked for the 29th. I advised her to come here if she has any problems before then. Pt agreed.

## 2014-11-02 NOTE — Telephone Encounter (Signed)
Left message for pt to call back  °

## 2014-11-13 ENCOUNTER — Other Ambulatory Visit: Payer: Self-pay | Admitting: Physician Assistant

## 2014-11-18 ENCOUNTER — Other Ambulatory Visit: Payer: Self-pay | Admitting: Physician Assistant

## 2014-11-18 ENCOUNTER — Other Ambulatory Visit: Payer: Self-pay | Admitting: Emergency Medicine

## 2014-11-21 ENCOUNTER — Other Ambulatory Visit: Payer: Self-pay | Admitting: Obstetrics & Gynecology

## 2014-11-21 DIAGNOSIS — R928 Other abnormal and inconclusive findings on diagnostic imaging of breast: Secondary | ICD-10-CM

## 2014-11-23 ENCOUNTER — Other Ambulatory Visit: Payer: Self-pay | Admitting: Obstetrics & Gynecology

## 2014-11-23 ENCOUNTER — Ambulatory Visit
Admission: RE | Admit: 2014-11-23 | Discharge: 2014-11-23 | Disposition: A | Discharge status: Home / Self Care | Payer: 59 | Source: Ambulatory Visit | Attending: Obstetrics & Gynecology | Admitting: Obstetrics & Gynecology

## 2014-11-23 DIAGNOSIS — R928 Other abnormal and inconclusive findings on diagnostic imaging of breast: Secondary | ICD-10-CM

## 2014-11-23 DIAGNOSIS — N6310 Unspecified lump in the right breast, unspecified quadrant: Secondary | ICD-10-CM

## 2014-11-23 DIAGNOSIS — N6320 Unspecified lump in the left breast, unspecified quadrant: Secondary | ICD-10-CM

## 2014-11-28 ENCOUNTER — Other Ambulatory Visit: Payer: Self-pay | Admitting: Obstetrics & Gynecology

## 2014-11-28 DIAGNOSIS — N6310 Unspecified lump in the right breast, unspecified quadrant: Secondary | ICD-10-CM

## 2014-11-30 ENCOUNTER — Other Ambulatory Visit: Payer: Self-pay | Admitting: Obstetrics & Gynecology

## 2014-12-01 ENCOUNTER — Ambulatory Visit
Admission: RE | Admit: 2014-12-01 | Discharge: 2014-12-01 | Disposition: A | Discharge status: Home / Self Care | Payer: 59 | Source: Ambulatory Visit | Attending: Obstetrics & Gynecology | Admitting: Obstetrics & Gynecology

## 2014-12-01 ENCOUNTER — Other Ambulatory Visit: Payer: Self-pay | Admitting: Obstetrics & Gynecology

## 2014-12-01 DIAGNOSIS — N6310 Unspecified lump in the right breast, unspecified quadrant: Secondary | ICD-10-CM

## 2014-12-01 DIAGNOSIS — N6320 Unspecified lump in the left breast, unspecified quadrant: Secondary | ICD-10-CM

## 2014-12-04 ENCOUNTER — Telehealth: Payer: Self-pay

## 2014-12-04 DIAGNOSIS — C50912 Malignant neoplasm of unspecified site of left female breast: Principal | ICD-10-CM

## 2014-12-04 DIAGNOSIS — C50911 Malignant neoplasm of unspecified site of right female breast: Secondary | ICD-10-CM

## 2014-12-04 NOTE — Telephone Encounter (Signed)
Tammy Harris [phone]) from [facility] called to report pt was Dxd w/bilateral breast cancer by biopsy. They need [place] to put in referral to Dr Mable Fill Howard-McNatt at [facility] since pt's ins is not accepted by [facility], ph [phone]. Spoke to Dr Everlene Farrier who called pt and gave OK to put in referral. Spoke to [place] in Referrals to make her aware of importance of timely referral.

## 2014-12-05 ENCOUNTER — Other Ambulatory Visit: Payer: Self-pay | Admitting: Emergency Medicine

## 2014-12-12 ENCOUNTER — Ambulatory Visit (INDEPENDENT_AMBULATORY_CARE_PROVIDER_SITE_OTHER): Payer: 59 | Admitting: Emergency Medicine

## 2014-12-12 ENCOUNTER — Encounter: Payer: Self-pay | Admitting: Emergency Medicine

## 2014-12-12 VITALS — BP 142/74 | HR 79 | Temp 98.8°F | Resp 18 | Ht 64.0 in | Wt 293.0 lb

## 2014-12-12 DIAGNOSIS — I10 Essential (primary) hypertension: Secondary | ICD-10-CM | POA: Diagnosis not present

## 2014-12-12 DIAGNOSIS — E119 Type 2 diabetes mellitus without complications: Secondary | ICD-10-CM

## 2014-12-12 DIAGNOSIS — E782 Mixed hyperlipidemia: Secondary | ICD-10-CM

## 2014-12-12 DIAGNOSIS — C50911 Malignant neoplasm of unspecified site of right female breast: Secondary | ICD-10-CM

## 2014-12-12 DIAGNOSIS — C50912 Malignant neoplasm of unspecified site of left female breast: Secondary | ICD-10-CM

## 2014-12-12 LAB — BASIC METABOLIC PANEL WITH GFR
BUN: 13 mg/dL (ref 7–25)
CHLORIDE: 101 mmol/L (ref 98–110)
CO2: 29 mmol/L (ref 20–31)
Calcium: 10.1 mg/dL (ref 8.6–10.4)
Creat: 0.77 mg/dL (ref 0.50–0.99)
GFR, EST NON AFRICAN AMERICAN: 84 mL/min (ref >=60)
Glucose, Bld: 108 mg/dL — ABNORMAL HIGH (ref 65–99)
POTASSIUM: 4.3 mmol/L (ref 3.5–5.3)
SODIUM: 139 mmol/L (ref 135–146)

## 2014-12-12 LAB — GLUCOSE, POCT (MANUAL RESULT ENTRY): POC GLUCOSE: 113 mg/dL — AB (ref 70–99)

## 2014-12-12 LAB — LIPID PANEL
CHOLESTEROL: 159 mg/dL (ref 125–200)
HDL: 51 mg/dL (ref >=46)
LDL Cholesterol: 77 mg/dL (ref <130)
Total CHOL/HDL Ratio: 3.1 Ratio (ref <=5.0)
Triglycerides: 153 mg/dL — ABNORMAL HIGH (ref <150)
VLDL: 31 mg/dL — ABNORMAL HIGH (ref <30)

## 2014-12-12 LAB — POCT GLYCOSYLATED HEMOGLOBIN (HGB A1C): Hemoglobin A1C: 6.9

## 2014-12-12 NOTE — Progress Notes (Signed)
This chart was scribed for [name], MD by Marti Sleigh, Medical Scribe. This patient was seen in Room 21 and the patient's care was started at 1:23 PM.  Chief Complaint:  Chief Complaint  Patient presents with  . Diabetes  . Hypertension  . Hyperlipidemia  . Breast Cancer    HPI: Tammy Harris is a 64 y.o. female who reports to [facility] today for a follow up after a recent breast cancer diagnosis. Pt has been diagnosed with bilateral breast cancer, two tumors which are both smaller than 1cm. Pt states that the feeling of wanting to cry is right beneath the surface since she was diagnosed with breast cancer. Pt has a family hx of ovarian cancer (grandmother), and pt's mother has a long hx of ovarian cysts. Last A1C was 6.8, done on 08/15/2014.  The pt has not told her mother yet about her diagnosis. She will tell her mother once she has a treatment plan.  No past medical history on file. No past surgical history on file. History   Social History  . Marital Status: Unknown    Spouse Name: N/A  . Number of Children: N/A  . Years of Education: N/A   Social History Main Topics  . Smoking status: Never Smoker   . Smokeless tobacco: Not on file  . Alcohol Use: Not on file  . Drug Use: Not on file  . Sexual Activity: Not on file   Other Topics Concern  . None   Social History Narrative   No family history on file. Allergies  Allergen Reactions  . Ace Inhibitors     Patient developed a cough with use of ACE inhibitors.  . Sulfa Antibiotics    Prior to Admission medications   Medication Sig Start Date End Date Taking? Authorizing Provider  amLODipine (NORVASC) 5 MG tablet Take 1 tablet (5 mg total) by mouth daily. 08/15/14   [name], MD  aspirin 81 MG tablet Take 81 mg by mouth daily.    Historical Provider, MD  fluconazole (DIFLUCAN) 150 MG tablet TAKE 1 TABLET (150 MG TOTAL) BY MOUTH ONCE. REPEAT IF NEEDED in one week 01/24/14   [name], MD  fluticasone  [facility]) 50 MCG/ACT nasal spray USE 2 SPRAYS INTO EACH NOSTRIL EVERY DAY 08/04/14   [name], MD  hydrochlorothiazide (HYDRODIURIL) 25 MG tablet TAKE 1 TABLET BY MOUTH EVERY DAY 07/13/14   [name], MD  hydrochlorothiazide (HYDRODIURIL) 25 MG tablet TAKE 1 TABLET BY MOUTH EVERY DAY. 07/16/14   Chelle Jeffery, PA-C  hydrochlorothiazide (HYDRODIURIL) 25 MG tablet TAKE 1 TABLET EVERY DAY 11/14/14   [name], MD  losartan (COZAAR) 100 MG tablet TAKE ONE TABLET BY MOUTH ONCE DAILY 12/05/14   [name], MD  metFORMIN (GLUCOPHAGE) 500 MG tablet TAKE ONE TABLET BY MOUTH TWICE DAILY WITH MEALS. 11/21/14   Ezekiel Slocumb, PA-C  methocarbamol (ROBAXIN) 500 MG tablet TAKE 1 TABLET (500 MG TOTAL) BY MOUTH 3 (THREE) TIMES DAILY. 08/22/14   [name], MD  pravastatin (PRAVACHOL) 40 MG tablet TAKE ONE TABLET BY MOUTH ONCE DAILY. 11/21/14   Ezekiel Slocumb, PA-C  triamcinolone cream (KENALOG) 0.1 % APPLY TWO TIMES DAILY AS DIRECTED 01/28/13   [name], MD  zoster vaccine live, PF, (ZOSTAVAX) 60045 UNT/0.65ML injection Inject 19,400 Units into the skin once. 04/18/14   [name], MD  zoster vaccine live, PF, (ZOSTAVAX) 99774 UNT/0.65ML injection Inject 19,400 Units into  the skin once. 08/15/14   [name], MD     ROS: The patient denies fevers, chills, night sweats, unintentional weight loss, chest pain, palpitations, wheezing, dyspnea on exertion, nausea, vomiting, abdominal pain, dysuria, hematuria, melena, numbness, weakness, or tingling.  All other systems have been reviewed and were otherwise negative with the exception of those mentioned in the HPI and as above.    PHYSICAL EXAM: Filed Vitals:   12/12/14 1320  BP: 142/74  Pulse: 79  Temp: 98.8 F (37.1 C)  Resp: 18   Body mass index is 50.27 kg/(m^2).   General: Alert, no acute distress HEENT:  Normocephalic, atraumatic, oropharynx patent. Eye: Juliette Mangle [facility] Cardiovascular:  Regular rate and rhythm, no rubs murmurs or gallops.   No Carotid bruits, radial pulse intact. No pedal edema.  Respiratory: Clear to auscultation bilaterally.  No wheezes, rales, or rhonchi.  No cyanosis, no use of accessory musculature Abdominal: No organomegaly, abdomen is soft and non-tender, positive bowel sounds.  No masses. Musculoskeletal: Gait intact. No edema, tenderness Skin: No rashes. Neurologic: Facial musculature symmetric. Psychiatric: Patient acts appropriately throughout our interaction. Lymphatic: No cervical or submandibular lymphadenopathy Genitourinary/Anorectal: No acute findings  BP recheck by Dr. Everlene Farrier, 130/80.  LABS: Results for orders placed or performed in visit on 08/15/14  Microalbumin, urine  Result Value Ref Range   Microalb, Ur 2.0 <2.0 mg/dL  CBC with Differential/Platelet  Result Value Ref Range   WBC 9.2 4.0 - 10.5 K/uL   RBC 4.91 3.87 - 5.11 MIL/uL   Hemoglobin 14.8 12.0 - 15.0 g/dL   HCT 44.3 36.0 - 46.0 %   MCV 90.2 78.0 - 100.0 fL   MCH 30.1 26.0 - 34.0 pg   MCHC 33.4 30.0 - 36.0 g/dL   RDW 13.2 11.5 - 15.5 %   Platelets 284 150 - 400 K/uL   MPV 11.5 8.6 - 12.4 fL   Neutrophils Relative % 59 43 - 77 %   Neutro Abs 5.4 1.7 - 7.7 K/uL   Lymphocytes Relative 27 12 - 46 %   Lymphs Abs 2.5 0.7 - 4.0 K/uL   Monocytes Relative 8 3 - 12 %   Monocytes Absolute 0.7 0.1 - 1.0 K/uL   Eosinophils Relative 5 0 - 5 %   Eosinophils Absolute 0.5 0.0 - 0.7 K/uL   Basophils Relative 1 0 - 1 %   Basophils Absolute 0.1 0.0 - 0.1 K/uL   Smear Review Criteria for review not met   COMPLETE METABOLIC PANEL WITH GFR  Result Value Ref Range   Sodium 140 135 - 145 mEq/L   Potassium 4.5 3.5 - 5.3 mEq/L   Chloride 102 96 - 112 mEq/L   CO2 26 19 - 32 mEq/L   Glucose, Bld 135 (H) 70 - 99 mg/dL   BUN 18 6 - 23 mg/dL   Creat 0.82 0.50 - 1.10 mg/dL   Total Bilirubin 0.5 0.2 - 1.2 mg/dL   Alkaline Phosphatase 61 39 - 117 U/L   AST 41 (H) 0 - 37 U/L   ALT 56 (H) 0 - 35 U/L   Total Protein 6.6 6.0 - 8.3 g/dL    Albumin 4.1 3.5 - 5.2 g/dL   Calcium 9.6 8.4 - 10.5 mg/dL   GFR, Est African American 89 mL/min   GFR, Est Non African American 77 mL/min  Lipid panel  Result Value Ref Range   Cholesterol 168 0 - 200 mg/dL   Triglycerides 153 (H) <150 mg/dL   HDL  51 >=46 mg/dL   Total CHOL/HDL Ratio 3.3 Ratio   VLDL 31 0 - 40 mg/dL   LDL Cholesterol 86 0 - 99 mg/dL  Hemoglobin A1c  Result Value Ref Range   Hgb A1c MFr Bld 6.8 (H) <5.7 %   Mean Plasma Glucose 148 (H) <117 mg/dL   Results for orders placed or performed in visit on 12/12/14  POCT glucose (manual entry)  Result Value Ref Range   POC Glucose 113 (A) 70 - 99 mg/dl  POCT glycosylated hemoglobin (Hb A1C)  Result Value Ref Range   Hemoglobin A1C 6.9      EKG/XRAY:   Primary read interpreted by Dr. [facility]lene Farrier at [facility].   ASSESSMENT/PLAN: 1. Type 2 diabetes mellitus without complication V4M is 6.9. I did not make any changes today her biggest problem is her weight. - POCT glucose (manual entry) - POCT glycosylated hemoglobin (Hb A1C)  2. Bilateral breast cancer She is scheduled to see the surgeon tomorrow at [facility]. After that we will have a better idea of her treatment plan.  3. Mixed hyperlipidemia She will continue her cholesterol medication. Lipid panel checked today - Lipid panel  4. Essential hypertension Blood pressure not quite at goal with patient under a lot of stress related to upcoming appointment with the surgeon. No changes made at the present time. - BASIC METABOLIC PANEL WITH GFR   I personally performed the services described in this documentation, which was scribed in my presence. The recorded information has been reviewed and is accurate.  [name], MD  [facility]  12/12/2014 2:48 PM  Gross sideeffects, risk and benefits, and alternatives of medications d/w patient. Patient is aware that all medications have potential sideeffects and we are unable to predict  every sideeffect or drug-drug interaction that may occur.  [name] MD 12/12/2014 1:23 PM

## 2014-12-13 DIAGNOSIS — Z853 Personal history of malignant neoplasm of breast: Secondary | ICD-10-CM | POA: Insufficient documentation

## 2014-12-13 DIAGNOSIS — C50919 Malignant neoplasm of unspecified site of unspecified female breast: Secondary | ICD-10-CM | POA: Insufficient documentation

## 2014-12-18 ENCOUNTER — Other Ambulatory Visit: Payer: Self-pay | Admitting: Emergency Medicine

## 2014-12-18 ENCOUNTER — Other Ambulatory Visit: Payer: Self-pay | Admitting: Physician Assistant

## 2015-02-13 ENCOUNTER — Encounter: Payer: Self-pay | Admitting: Emergency Medicine

## 2015-02-16 ENCOUNTER — Other Ambulatory Visit: Payer: Self-pay | Admitting: Emergency Medicine

## 2015-02-20 ENCOUNTER — Ambulatory Visit: Payer: 59 | Admitting: Emergency Medicine

## 2015-02-27 ENCOUNTER — Other Ambulatory Visit: Payer: Self-pay | Admitting: Physician Assistant

## 2015-02-27 ENCOUNTER — Other Ambulatory Visit: Payer: Self-pay | Admitting: Emergency Medicine

## 2015-03-31 ENCOUNTER — Other Ambulatory Visit: Payer: Self-pay | Admitting: Emergency Medicine

## 2015-04-10 ENCOUNTER — Ambulatory Visit: Payer: 59 | Admitting: Emergency Medicine

## 2015-05-02 ENCOUNTER — Other Ambulatory Visit: Payer: Self-pay | Admitting: Physician Assistant

## 2015-05-28 ENCOUNTER — Other Ambulatory Visit: Payer: Self-pay | Admitting: Emergency Medicine

## 2015-06-25 ENCOUNTER — Other Ambulatory Visit: Payer: Self-pay | Admitting: Emergency Medicine

## 2015-06-25 ENCOUNTER — Other Ambulatory Visit: Payer: Self-pay | Admitting: Physician Assistant

## 2015-06-26 NOTE — Telephone Encounter (Signed)
APPT [facility] 3/23.

## 2015-07-29 ENCOUNTER — Other Ambulatory Visit: Payer: Self-pay | Admitting: Emergency Medicine

## 2015-08-01 ENCOUNTER — Other Ambulatory Visit: Payer: Self-pay | Admitting: Emergency Medicine

## 2015-08-02 ENCOUNTER — Ambulatory Visit (INDEPENDENT_AMBULATORY_CARE_PROVIDER_SITE_OTHER): Payer: BLUE CROSS/BLUE SHIELD | Admitting: Emergency Medicine

## 2015-08-02 ENCOUNTER — Encounter: Payer: Self-pay | Admitting: Emergency Medicine

## 2015-08-02 VITALS — BP 101/73 | HR 100 | Temp 98.2°F | Resp 16 | Ht 65.0 in | Wt 287.4 lb

## 2015-08-02 DIAGNOSIS — E119 Type 2 diabetes mellitus without complications: Secondary | ICD-10-CM

## 2015-08-02 DIAGNOSIS — I10 Essential (primary) hypertension: Secondary | ICD-10-CM | POA: Diagnosis not present

## 2015-08-02 DIAGNOSIS — E782 Mixed hyperlipidemia: Secondary | ICD-10-CM | POA: Diagnosis not present

## 2015-08-02 LAB — BASIC METABOLIC PANEL WITH GFR
BUN: 16 mg/dL (ref 7–25)
CHLORIDE: 103 mmol/L (ref 98–110)
CO2: 26 mmol/L (ref 20–31)
Calcium: 10 mg/dL (ref 8.6–10.4)
Creat: 0.75 mg/dL (ref 0.50–0.99)
GFR, EST NON AFRICAN AMERICAN: 86 mL/min (ref >=60)
GLUCOSE: 114 mg/dL — AB (ref 65–99)
POTASSIUM: 3.9 mmol/L (ref 3.5–5.3)
Sodium: 141 mmol/L (ref 135–146)

## 2015-08-02 LAB — LIPID PANEL
Cholesterol: 196 mg/dL (ref 125–200)
HDL: 50 mg/dL (ref >=46)
LDL Cholesterol: 105 mg/dL (ref <130)
Total CHOL/HDL Ratio: 3.9 Ratio (ref <=5.0)
Triglycerides: 206 mg/dL — ABNORMAL HIGH (ref <150)
VLDL: 41 mg/dL — ABNORMAL HIGH (ref <30)

## 2015-08-02 LAB — POCT GLYCOSYLATED HEMOGLOBIN (HGB A1C): HEMOGLOBIN A1C: 7.7

## 2015-08-02 LAB — GLUCOSE, POCT (MANUAL RESULT ENTRY): POC GLUCOSE: 127 mg/dL — AB (ref 70–99)

## 2015-08-02 MED ORDER — METFORMIN HCL 500 MG PO TABS: 1000.0000 mg | ORAL_TABLET | Freq: Two times a day (BID) | ORAL | Status: DC

## 2015-08-02 NOTE — Progress Notes (Addendum)
Patient ID: Tammy Harris, female   DOB: April 17, 1951, 65 y.o.   MRN: [id number]     By signing my name below, I, Zola Button, attest that this documentation has been prepared under the direction and in the presence of [name], MD.  Electronically Signed: Zola Button, Medical Scribe. 08/02/2015. 4:32 PM.   Chief Complaint:  Chief Complaint  Patient presents with  . Follow-up  . Diabetes    HPI: Tammy Harris is a 65 y.o. female with a history of breast cancer, morbid obesity, DM, HTN, and HLD who reports to [facility] today for a follow-up. She does not check her blood sugar at home. She had her first mammogram after the surgery a few weeks ago which showed no signs of cancer.  No past medical history on file. No past surgical history on file. Social History   Social History  . Marital Status: Unknown    Spouse Name: N/A  . Number of Children: N/A  . Years of Education: N/A   Social History Main Topics  . Smoking status: Never Smoker   . Smokeless tobacco: None  . Alcohol Use: None  . Drug Use: None  . Sexual Activity: Not Asked   Other Topics Concern  . None   Social History Narrative   No family history on file. Allergies  Allergen Reactions  . Ace Inhibitors     Patient developed a cough with use of ACE inhibitors.  . Sulfa Antibiotics    Prior to Admission medications   Medication Sig Start Date End Date Taking? Authorizing Provider  amLODipine (NORVASC) 5 MG tablet TAKE ONE TABLET BY MOUTH ONCE DAILY 07/29/15  Yes [name], MD  aspirin 81 MG tablet Take 81 mg by mouth daily.   Yes Historical Provider, MD  fluconazole (DIFLUCAN) 150 MG tablet TAKE 1 TABLET (150 MG TOTAL) BY MOUTH ONCE. REPEAT IF NEEDED in one week 01/24/14  Yes [name], MD  fluticasone [facility]) 50 MCG/ACT nasal spray USE 2 SPRAYS INTO EACH NOSTRIL EVERY DAY 08/04/14  Yes [name], MD  hydrochlorothiazide (HYDRODIURIL) 25 MG tablet TAKE ONE TABLET BY MOUTH ONCE DAILY 05/03/15  Yes  [name], MD  losartan (COZAAR) 100 MG tablet TAKE ONE TABLET BY MOUTH ONCE DAILY 06/26/15  Yes [name], MD  metFORMIN (GLUCOPHAGE) 500 MG tablet TAKE ONE TABLET BY MOUTH TWICE DAILY WITH MEALS 07/29/15  Yes [name], MD  methocarbamol (ROBAXIN) 500 MG tablet TAKE ONE TABLET BY MOUTH THREE TIMES DAILY 06/26/15  Yes [name], MD  pravastatin (PRAVACHOL) 40 MG tablet TAKE ONE TABLET BY MOUTH ONCE DAILY 05/29/15  Yes Chelle Jeffery, PA-C  triamcinolone cream (KENALOG) 0.1 % APPLY TWO TIMES DAILY AS DIRECTED 01/28/13  Yes [name], MD  zoster vaccine live, PF, (ZOSTAVAX) 29562 UNT/0.65ML injection Inject 19,400 Units into the skin once. Patient not taking: Reported on 08/02/2015 04/18/14   [name], MD  zoster vaccine live, PF, (ZOSTAVAX) 13086 UNT/0.65ML injection Inject 19,400 Units into the skin once. Patient not taking: Reported on 08/02/2015 08/15/14   [name], MD     ROS: The patient denies fevers, chills, night sweats, unintentional weight loss, chest pain, palpitations, wheezing, dyspnea on exertion, nausea, vomiting, abdominal pain, dysuria, hematuria, melena, numbness, weakness, or tingling.  All other systems have been reviewed and were otherwise negative with the exception of those mentioned in the HPI and as above.    PHYSICAL EXAM: Filed Vitals:  08/02/15 1549  BP: 101/73  Pulse: 100  Temp: 98.2 F (36.8 C)  Resp: 16   Body mass index is 47.83 kg/(m^2).   General: Alert, no acute distress HEENT:  Normocephalic, atraumatic, oropharynx patent. Eye: Juliette Mangle [facility] Cardiovascular:  Regular rate and rhythm, no rubs murmurs or gallops.  No Carotid bruits, radial pulse intact. No pedal edema.  Respiratory: Clear to auscultation bilaterally.  No wheezes, rales, or rhonchi.  No cyanosis, no use of accessory musculature Abdominal: No organomegaly, abdomen is soft and non-tender, positive bowel sounds.  No masses. Musculoskeletal: Gait intact. 1+  edema. Skin: No rashes. Neurologic: Facial musculature symmetric. Psychiatric: Patient acts appropriately throughout our interaction. Lymphatic: No cervical or submandibular lymphadenopathy    LABS: Results for orders placed or performed in visit on 08/02/15  POCT glucose (manual entry)  Result Value Ref Range   POC Glucose 127 (A) 70 - 99 mg/dl  POCT glycosylated hemoglobin (Hb A1C)  Result Value Ref Range   Hemoglobin A1C 7.7    EKG/XRAY:   Primary read interpreted by Dr. Everlene Farrier at [facility].   ASSESSMENT/PLAN:  Hemoglobin A1c is up. We'll increase Glucophage to 1 g twice a day. She will work harder on diet and exercise. I personally performed the services described in this documentation, which was scribed in my presence. The recorded information has been reviewed and is accurate.   Gross sideeffects, risk and benefits, and alternatives of medications d/w patient. Patient is aware that all medications have potential sideeffects and we are unable to predict every sideeffect or drug-drug interaction that may occur.  [name] MD 08/02/2015 4:32 PM

## 2015-08-02 NOTE — Patient Instructions (Addendum)
Increase your Glucophage to 2 tablets twice a day.    IF you received an x-ray today, you will receive an invoice from [facility]. Please contact [facility] at [phone] with questions or concerns regarding your invoice.   IF you received labwork today, you will receive an invoice from [employer]. Please contact [facility] at [phone] with questions or concerns regarding your invoice.   Our billing staff will not be able to assist you with questions regarding bills from these companies.  You will be contacted with the lab results as soon as they are available. The fastest way to get your results is to activate your My Chart account. Instructions are located on the last page of this paperwork. If you have not heard from [place] regarding the results in 2 weeks, please contact this office.

## 2015-09-05 ENCOUNTER — Other Ambulatory Visit: Payer: Self-pay | Admitting: Physician Assistant

## 2015-09-20 ENCOUNTER — Other Ambulatory Visit: Payer: Self-pay | Admitting: Emergency Medicine

## 2015-09-21 ENCOUNTER — Other Ambulatory Visit: Payer: Self-pay | Admitting: Emergency Medicine

## 2015-11-30 ENCOUNTER — Other Ambulatory Visit: Payer: Self-pay | Admitting: Emergency Medicine

## 2015-11-30 ENCOUNTER — Other Ambulatory Visit: Payer: Self-pay | Admitting: Physician Assistant

## 2015-12-13 ENCOUNTER — Other Ambulatory Visit: Payer: Self-pay | Admitting: Emergency Medicine

## 2016-01-24 ENCOUNTER — Other Ambulatory Visit: Payer: Self-pay

## 2016-01-24 MED ORDER — METHOCARBAMOL 500 MG PO TABS: 500.0000 mg | ORAL_TABLET | Freq: Three times a day (TID) | ORAL | 0 refills | Status: DC

## 2016-02-21 ENCOUNTER — Telehealth: Payer: Self-pay

## 2016-02-21 ENCOUNTER — Encounter: Payer: BLUE CROSS/BLUE SHIELD | Admitting: Family Medicine

## 2016-02-21 NOTE — Telephone Encounter (Signed)
ERROR

## 2016-02-22 ENCOUNTER — Other Ambulatory Visit: Payer: Self-pay

## 2016-02-22 NOTE — Telephone Encounter (Signed)
Patient stated pharmacy denied medication due to need of office visit. Patient stated she have an office visit on 03/04/2016. Appointment was canceled due to provider.    methocarbamol (ROBAXIN) 500 MG tablet  Patient request refill as soon as possible.

## 2016-02-23 IMAGING — MG MM DIGITAL DIAGNOSTIC BILAT CAD
4 series · 4 of 4 positions shown · non-contrast
Comparison: Previous exam(s).

CLINICAL DATA: Status post ultrasound-guided core needle biopsy of
the right breast, and affirm stereotactic biopsy of the left breast.

EXAM:
DIAGNOSTIC BILATERAL MAMMOGRAM POST ULTRASOUND BIOPSY

[R CC]
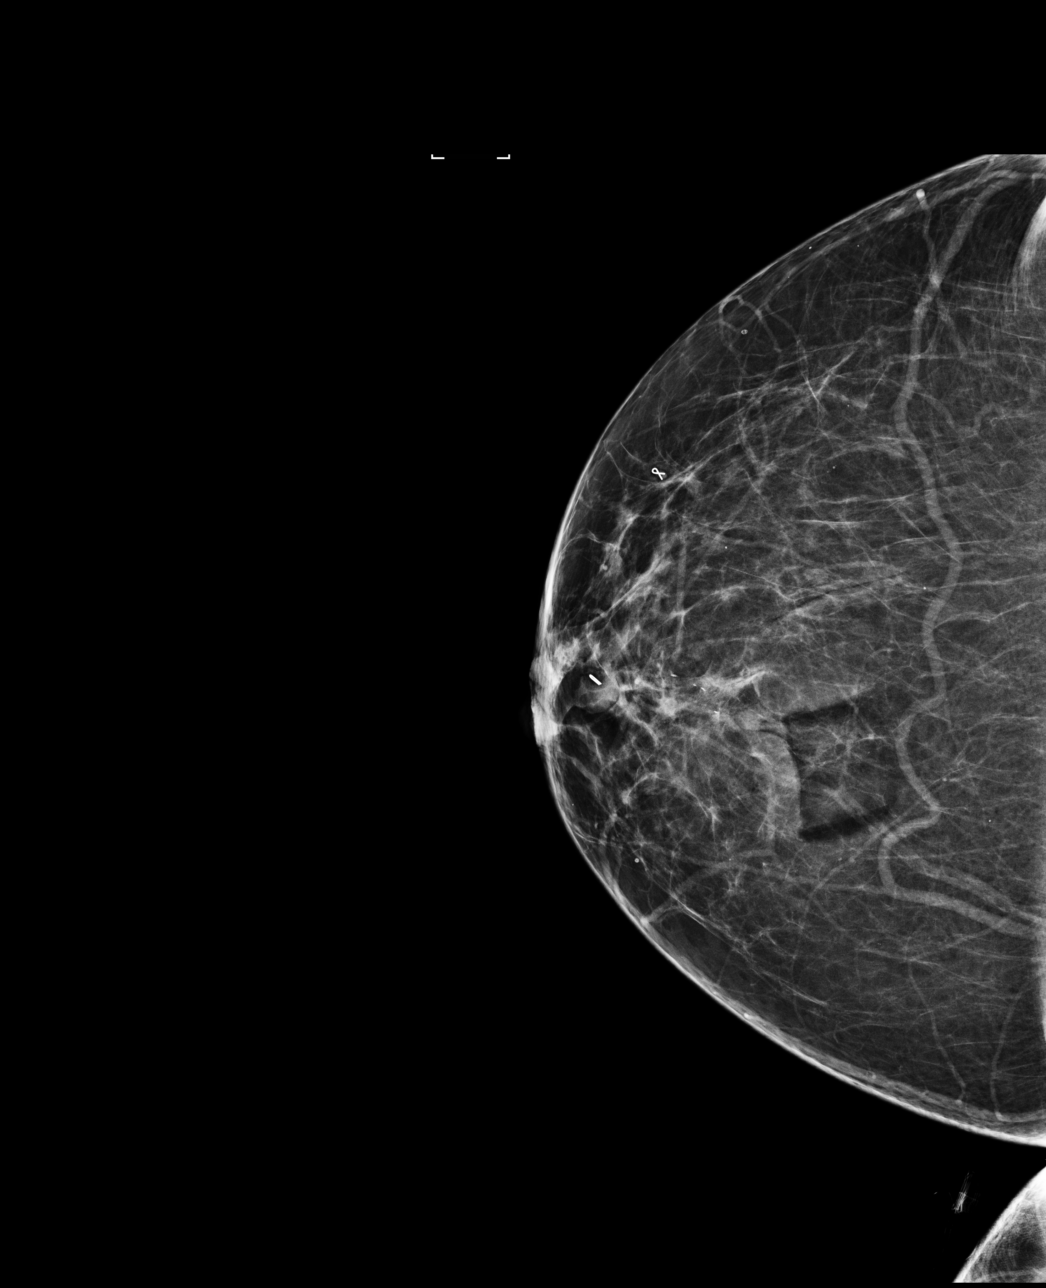

[L CC]
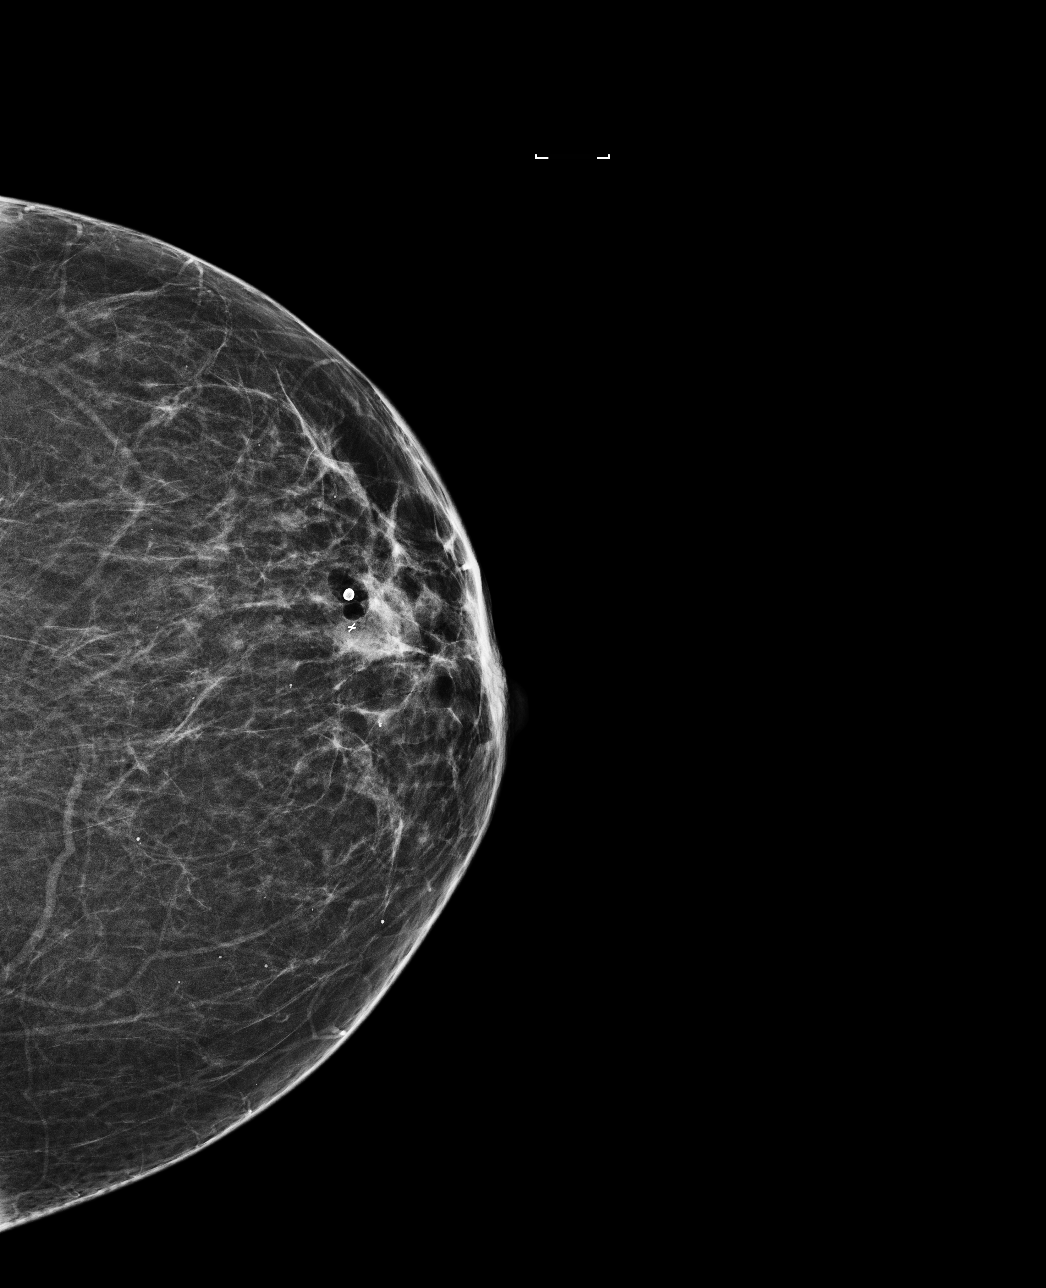

[L ML]
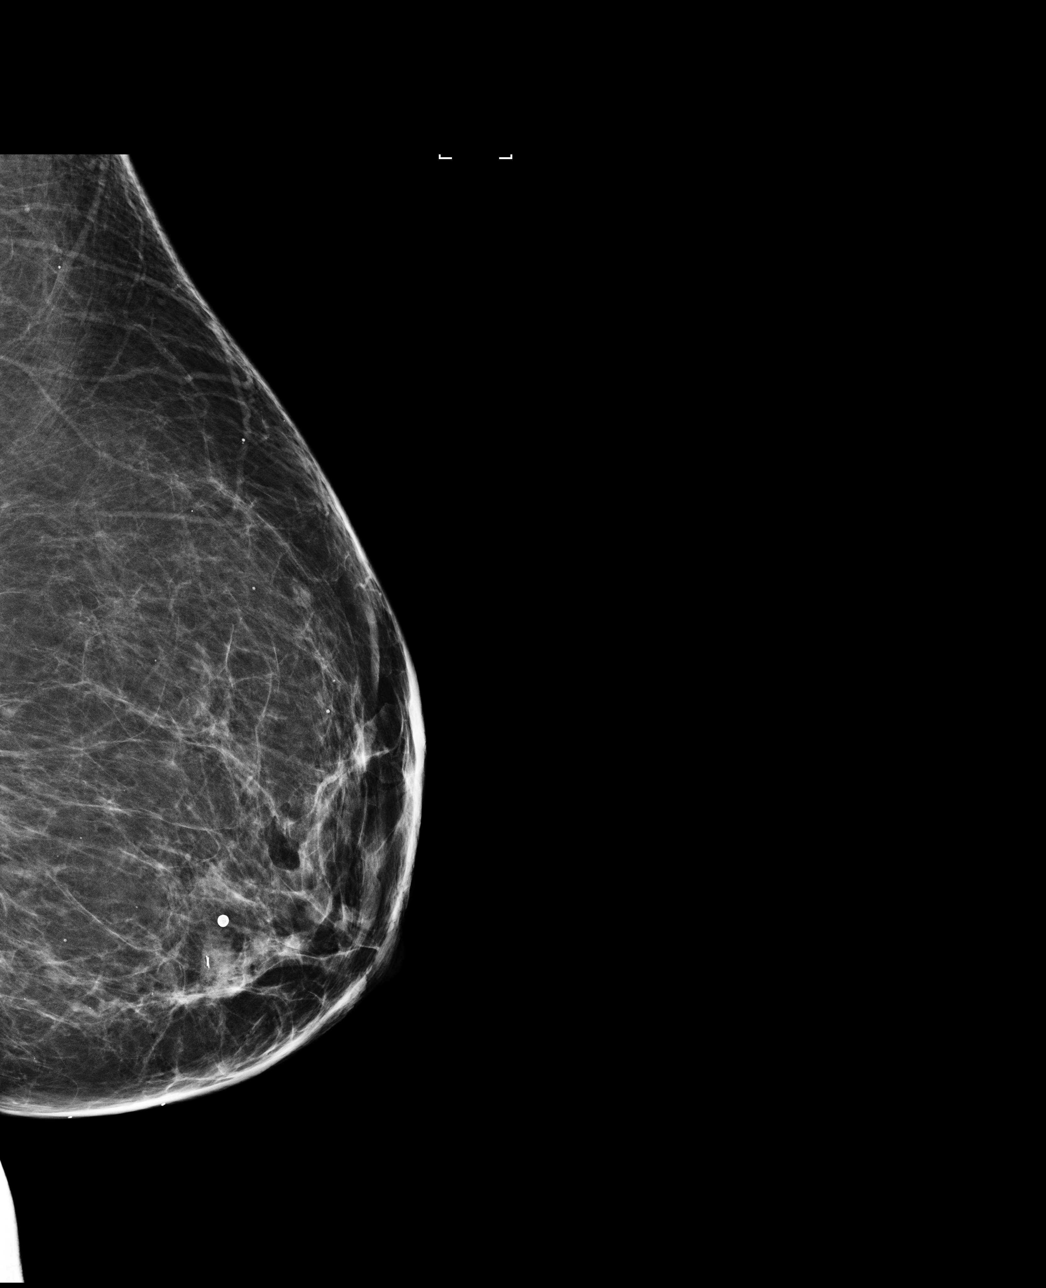

[R ML]
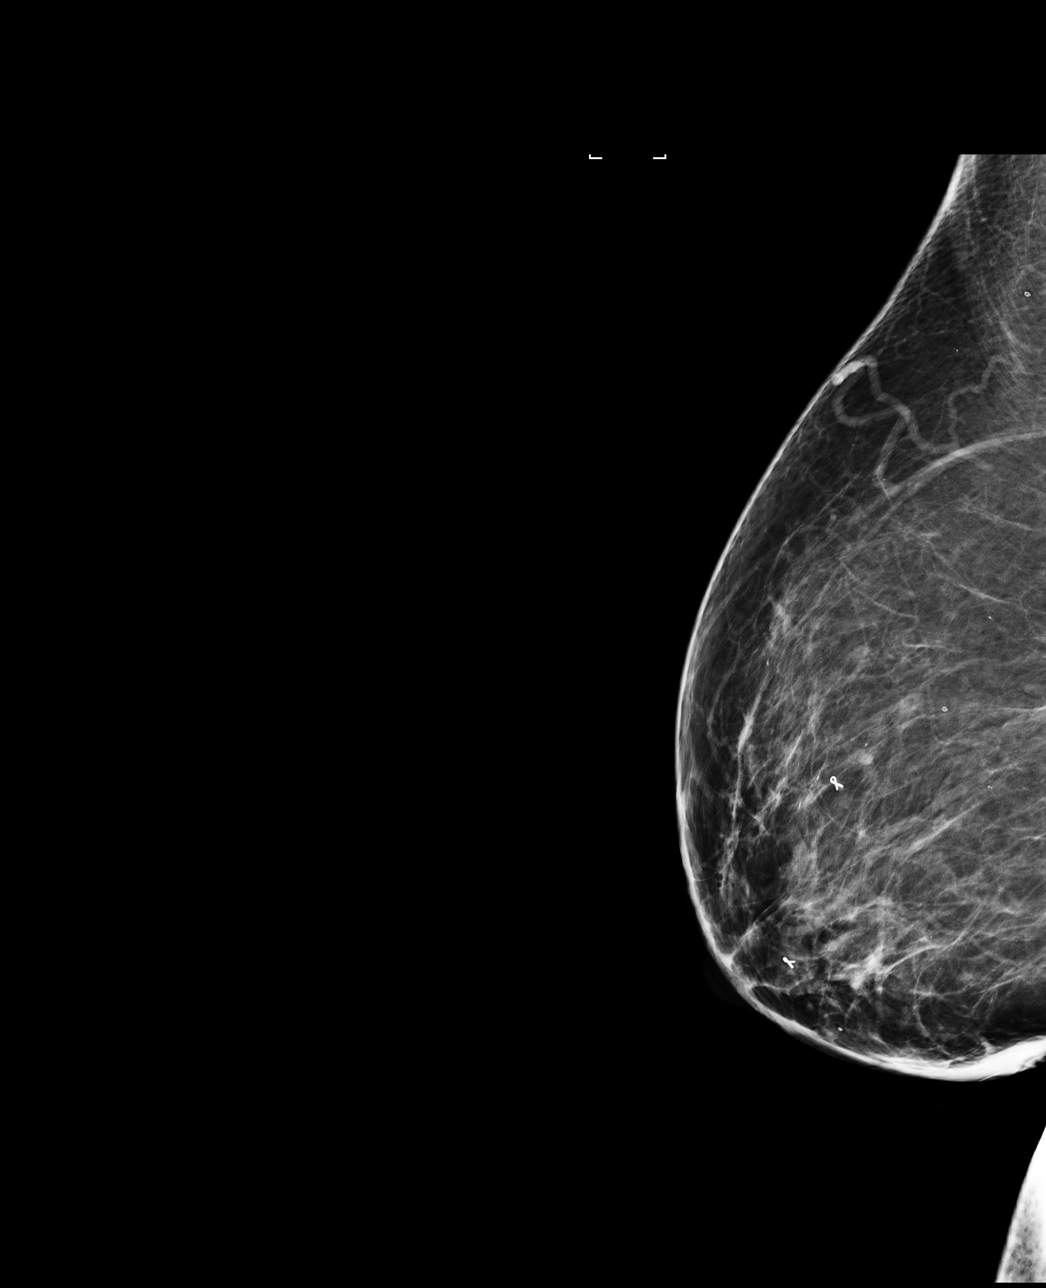

[4 of 4 positions shown; findings below may reference images not displayed]

FINDINGS: Mammographic images were obtained following ultrasound guided biopsy
of right breast, an stereotactic biopsy of the left breast. Images
demonstrate a ribbon shaped metallic marker in the subareolar right
7 o'clock breast, within previously identified mass, and X shaped
metallic marker within left 5 o'clock breast mass. Expected post
biopsy changes are noted.
IMPRESSION: Post biopsy marker placement within right 7 o'clock subareolar
breast mass, and left 5 o'clock breast mass.

Final Assessment: Post Procedure Mammograms for Marker Placement

## 2016-02-23 MED ORDER — METHOCARBAMOL 500 MG PO TABS: 500.0000 mg | ORAL_TABLET | Freq: Three times a day (TID) | ORAL | 0 refills | Status: DC

## 2016-02-23 NOTE — Telephone Encounter (Signed)
Will you give 1 month until she reschedules to see you Dr Carlota Raspberry? Her 03/06/16 appointment was cancelled?

## 2016-03-04 ENCOUNTER — Ambulatory Visit (INDEPENDENT_AMBULATORY_CARE_PROVIDER_SITE_OTHER): Payer: BLUE CROSS/BLUE SHIELD | Admitting: Family Medicine

## 2016-03-04 VITALS — BP 132/90 | HR 101 | Temp 98.3°F | Resp 17 | Ht 65.0 in | Wt 280.0 lb

## 2016-03-04 DIAGNOSIS — Z23 Encounter for immunization: Secondary | ICD-10-CM

## 2016-03-04 DIAGNOSIS — Z853 Personal history of malignant neoplasm of breast: Secondary | ICD-10-CM | POA: Diagnosis not present

## 2016-03-04 DIAGNOSIS — E781 Pure hyperglyceridemia: Secondary | ICD-10-CM

## 2016-03-04 DIAGNOSIS — E119 Type 2 diabetes mellitus without complications: Secondary | ICD-10-CM

## 2016-03-04 DIAGNOSIS — I10 Essential (primary) hypertension: Secondary | ICD-10-CM | POA: Diagnosis not present

## 2016-03-04 LAB — LIPID PANEL
CHOL/HDL RATIO: 3.4 ratio (ref <=5.0)
CHOLESTEROL: 174 mg/dL (ref 125–200)
HDL: 51 mg/dL (ref >=46)
LDL Cholesterol: 87 mg/dL (ref <130)
Triglycerides: 181 mg/dL — ABNORMAL HIGH (ref <150)
VLDL: 36 mg/dL — ABNORMAL HIGH (ref <30)

## 2016-03-04 LAB — COMPLETE METABOLIC PANEL WITH GFR
ALBUMIN: 4.2 g/dL (ref 3.6–5.1)
ALK PHOS: 61 U/L (ref 33–130)
ALT: 42 U/L — ABNORMAL HIGH (ref 6–29)
AST: 28 U/L (ref 10–35)
BUN: 17 mg/dL (ref 7–25)
CALCIUM: 10.7 mg/dL — AB (ref 8.6–10.4)
CHLORIDE: 102 mmol/L (ref 98–110)
CO2: 28 mmol/L (ref 20–31)
Creat: 0.85 mg/dL (ref 0.50–0.99)
GFR, Est African American: 85 mL/min (ref >=60)
GFR, Est Non African American: 74 mL/min (ref >=60)
GLUCOSE: 127 mg/dL — AB (ref 65–99)
POTASSIUM: 4.1 mmol/L (ref 3.5–5.3)
SODIUM: 141 mmol/L (ref 135–146)
Total Bilirubin: 0.4 mg/dL (ref 0.2–1.2)
Total Protein: 7 g/dL (ref 6.1–8.1)

## 2016-03-04 MED ORDER — PRAVASTATIN SODIUM 40 MG PO TABS: 40.0000 mg | ORAL_TABLET | Freq: Every day | ORAL | 1 refills | Status: DC

## 2016-03-04 MED ORDER — AMLODIPINE BESYLATE 5 MG PO TABS: 5.0000 mg | ORAL_TABLET | Freq: Every day | ORAL | 1 refills | Status: DC

## 2016-03-04 MED ORDER — METFORMIN HCL 1000 MG PO TABS: 1000.0000 mg | ORAL_TABLET | Freq: Two times a day (BID) | ORAL | 1 refills | Status: DC

## 2016-03-04 MED ORDER — LOSARTAN POTASSIUM-HCTZ 100-25 MG PO TABS: 1.0000 | ORAL_TABLET | Freq: Every day | ORAL | 1 refills | Status: DC

## 2016-03-04 NOTE — Patient Instructions (Addendum)
schedule eye care appointment as well as dentist appointment. I change the metformin dose to 1000 mg pill, take one pill each day of that medication. This will be the same dosage you're currently taking, but less pills.  I also changed your blood pressure medications to a combination pill of the same doses of losartan and HCTZ.  This did not change the actual medication amount you're taking, just the number of pills.  Follow-up in 6 months as long as A1c is controlled. Let me know if you have any questions in the meantime.   IF you received an x-ray today, you will receive an invoice from [facility]. Please contact [facility] at [phone] with questions or concerns regarding your invoice.   IF you received labwork today, you will receive an invoice from [employer]. Please contact [facility] at [phone] with questions or concerns regarding your invoice.   Our billing staff will not be able to assist you with questions regarding bills from these companies.  You will be contacted with the lab results as soon as they are available. The fastest way to get your results is to activate your My Chart account. Instructions are located on the last page of this paperwork. If you have not heard from [place] regarding the results in 2 weeks, please contact this office.

## 2016-03-04 NOTE — Progress Notes (Signed)
By signing my name below, I, Tammy Harris, attest that this documentation has been prepared under the direction and in the presence of Tammy Ray, MD.  Electronically Signed: Verlee Harris, Medical Scribe. 03/04/16. 12:05 PM.  Subjective:    Patient ID: Tammy Harris, female    DOB: 1951-01-01, 65 y.o.   MRN: [id number]  HPI Chief Complaint  Patient presents with  . Diabetes  . Flu Vaccine  . Anemia    HPI Comments: Tammy Harris is a 65 y.o. female who presents to the [facility] for DM follow-up. She is a new pt to me and prev pt of Dr. Everlene Farrier. She was last seen in March. Pt takes potassium and flax seed supplements. Pt is fasting.  Breast CA: She has a hx of breast CA with reported mammogram after treatment which was nl. Mammogram March 3rd at [facility], status post right lumpectomy. Post surgical changes, no suspicious findings. Her surgeon is Dr. Elisha Headland, oncologist Dr. Marcello Moores. Pt goes back to her oncologist Nov 9th and doesn't need another mammogram until next year.  DM: Takes Metformin 1000mg  BID. She was seen by Dr. Everlene Farrier in March and he increased her metformin from 500mg  to 1000mg . Pt has been compliant with her medication. Pt is aware she needs a ophthalmology visit, but denies diabetic retinopathy since her last visit. Pt hasn't seen her dentist in a while. Pt denies experiencing any new side effect while on this medication or any diabetic changes since she was last seen.. Lab Results  Component Value Date   HGBA1C 7.7 08/02/2015   Lab Results  Component Value Date   MICROALBUR 2.0 08/15/2014   Wt Readings from Last 3 Encounters:  03/04/16 280 lb (127 kg)  08/02/15 287 lb 6.4 oz (130.4 kg)  12/12/14 293 lb (132.9 kg)   HLD: She is on pravastatin QD for cholesterol. Pt denies experiencing any side effects while on this medication. Lab Results  Component Value Date   CHOL 196 08/02/2015   HDL 50 08/02/2015   LDLCALC 105  08/02/2015   TRIG 206 (H) 08/02/2015   CHOLHDL 3.9 08/02/2015   Lab Results  Component Value Date   ALT 56 (H) 08/15/2014   AST 41 (H) 08/15/2014   ALKPHOS 61 08/15/2014   BILITOT 0.5 08/15/2014   HTN: Takes losartan, HCTZ and amlodipine. Pt has not been checking her bp although she has a bp reader at home. Pt states her bp is usually better at home. Pt denies experiencing any side effects while on her medication.  Lab Results  Component Value Date   CREATININE 0.75 08/02/2015   Immunizations: Pt would like to get her flu vaccine today. Immunization History  Administered Date(s) Administered  . Influenza Split 03/30/2012  . Influenza,inj,Quad PF,36+ Mos 01/11/2013, 12/20/2013  . Influenza-Unspecified 01/11/2015  . Pneumococcal Conjugate-13 08/15/2014  . Tdap 08/26/2011    Patient Active Problem List   Diagnosis Date Noted  . Morbid obesity ([place]) 08/15/2014  . Hypertension 03/30/2012  . Hyperlipidemia 03/30/2012  . Diabetes ([place]) 03/30/2012   No past medical history on file. No past surgical history on file. Allergies  Allergen Reactions  . Ace Inhibitors     Patient developed a cough with use of ACE inhibitors.  . Sulfa Antibiotics    Prior to Admission medications   Medication Sig Start Date End Date Taking? Authorizing Provider  amLODipine (NORVASC) 5 MG tablet TAKE ONE TABLET BY MOUTH ONCE DAILY 07/29/15  Yes Loura Back  Daub, MD  aspirin 81 MG tablet Take 81 mg by mouth daily.   Yes Historical Provider, MD  fluconazole (DIFLUCAN) 150 MG tablet TAKE 1 TABLET (150 MG TOTAL) BY MOUTH ONCE. REPEAT IF NEEDED in one week 01/24/14  Yes [name], MD  fluticasone [facility]) 50 MCG/ACT nasal spray USE 2 SPRAYS INTO EACH NOSTRIL EVERY DAY 08/04/14  Yes [name], MD  hydrochlorothiazide (HYDRODIURIL) 25 MG tablet TAKE ONE TABLET BY MOUTH ONCE DAILY 08/03/15  Yes [name], MD  losartan (COZAAR) 100 MG tablet TAKE ONE TABLET BY MOUTH ONCE DAILY 12/14/15  Yes [name],  MD  metFORMIN (GLUCOPHAGE) 500 MG tablet Take 2 tablets (1,000 mg total) by mouth 2 (two) times daily with a meal. 08/02/15  Yes [name], MD  methocarbamol (ROBAXIN) 500 MG tablet Take 1 tablet (500 mg total) by mouth 3 (three) times daily. 02/23/16  Yes Wendie Agreste, MD  pravastatin (PRAVACHOL) 40 MG tablet Take 1 tablet (40 mg total) by mouth daily. 11/30/15  Yes [name], MD   Social History   Social History  . Marital status: Unknown    Spouse name: N/A  . Number of children: N/A  . Years of education: N/A   Occupational History  . Not on file.   Social History Main Topics  . Smoking status: Never Smoker  . Smokeless tobacco: Not on file  . Alcohol use Not on file  . Drug use: Unknown  . Sexual activity: Not on file   Other Topics Concern  . Not on file   Social History Narrative  . No narrative on file   Review of Systems  Constitutional: Negative for fatigue and unexpected weight change.  Respiratory: Negative for chest tightness and shortness of breath.   Cardiovascular: Negative for chest pain, palpitations and leg swelling.  Gastrointestinal: Negative for abdominal pain and blood in stool.  Neurological: Negative for dizziness, syncope, light-headedness and headaches.   Objective:  Physical Exam  Constitutional: She is oriented to person, place, and time. She appears well-developed and well-nourished.  HENT:  Head: Normocephalic and atraumatic.  Eyes: Conjunctivae and EOM are normal. Pupils are equal, round, and reactive to light.  Neck: Carotid bruit is not present.  Cardiovascular: Normal rate, regular rhythm, normal heart sounds and intact distal pulses.  Exam reveals no friction rub.   No murmur heard. Pulmonary/Chest: Effort normal and breath sounds normal. No respiratory distress. She has no wheezes. She has no rales.  Abdominal: Soft. She exhibits no pulsatile midline mass. There is no tenderness.  Musculoskeletal: She exhibits no edema.    Neurological: She is alert and oriented to person, place, and time.  Skin: Skin is warm and dry.  Psychiatric: She has a normal mood and affect. Her behavior is normal.  Vitals reviewed.  BP 132/90 (BP Location: Right Arm, Patient Position: Sitting, Cuff Size: Large)   Pulse (!) 101   Temp 98.3 F (36.8 C) (Oral)   Resp 17   Ht 5\' 5"  (1.651 m)   Wt 280 lb (127 kg)   SpO2 96%   BMI 46.59 kg/m  Assessment & Plan:   Antia Berish is a 65 y.o. female Type 2 diabetes mellitus without complication, without long-term current use of insulin ([place]) - Plan: Hemoglobin A1C, Microalbumin, urine, metFORMIN (GLUCOPHAGE) 1000 MG tablet  - Change to 1000 mg pill is tolerating that dose. Continue 1000 g twice a day. Check A1c, check urine microalbumin.  Pure  hyperglyceridemia - Plan: COMPLETE METABOLIC PANEL WITH GFR, Lipid panel, pravastatin (PRAVACHOL) 40 MG tablet  -Tolerating statin, continue same dose, lipids and CMP pending  Essential hypertension - Plan: COMPLETE METABOLIC PANEL WITH GFR, losartan-hydrochlorothiazide (HYZAAR) 100-25 MG tablet, amLODipine (NORVASC) 5 MG tablet  - Stable. Changed losartan/HCTZ to combination product of same doses. Continue amlodipine same dose.  History of breast cancer  - Doing well, continue follow-up with care providers at [facility].   Flu vaccine need - Plan: Flu Vaccine QUAD 36+ mos IM   Meds ordered this encounter  Medications  . metFORMIN (GLUCOPHAGE) 1000 MG tablet    Sig: Take 1 tablet (1,000 mg total) by mouth 2 (two) times daily with a meal.    Dispense:  180 tablet    Refill:  1  . pravastatin (PRAVACHOL) 40 MG tablet    Sig: Take 1 tablet (40 mg total) by mouth daily.    Dispense:  90 tablet    Refill:  1  . losartan-hydrochlorothiazide (HYZAAR) 100-25 MG tablet    Sig: Take 1 tablet by mouth daily.    Dispense:  90 tablet    Refill:  1  . amLODipine (NORVASC) 5 MG tablet    Sig: Take 1 tablet (5 mg total) by mouth daily.     Dispense:  90 tablet    Refill:  1   Patient Instructions   schedule eye care appointment as well as dentist appointment. I change the metformin dose to 1000 mg pill, take one pill each day of that medication. This will be the same dosage you're currently taking, but less pills.  I also changed your blood pressure medications to a combination pill of the same doses of losartan and HCTZ.  This did not change the actual medication amount you're taking, just the number of pills.  Follow-up in 6 months as long as A1c is controlled. Let me know if you have any questions in the meantime.   IF you received an x-Harris today, you will receive an invoice from [facility]. Please contact [facility] at [phone] with questions or concerns regarding your invoice.   IF you received labwork today, you will receive an invoice from [employer]. Please contact [facility] at [phone] with questions or concerns regarding your invoice.   Our billing staff will not be able to assist you with questions regarding bills from these companies.  You will be contacted with the lab results as soon as they are available. The fastest way to get your results is to activate your My Chart account. Instructions are located on the last page of this paperwork. If you have not heard from [place] regarding the results in 2 weeks, please contact this office.        I personally performed the services described in this documentation, which was scribed in my presence. The recorded information has been reviewed and considered, and addended by me as needed.   Signed,   Tammy Ray, MD [facility].  03/06/16 8:40 AM

## 2016-03-05 LAB — HEMOGLOBIN A1C
Hgb A1c MFr Bld: 6.8 % — ABNORMAL HIGH (ref <5.7)
Mean Plasma Glucose: 148 mg/dL

## 2016-03-05 LAB — MICROALBUMIN, URINE: Microalb, Ur: 0.7 mg/dL

## 2016-03-06 ENCOUNTER — Ambulatory Visit: Payer: BLUE CROSS/BLUE SHIELD | Admitting: Family Medicine

## 2016-04-22 ENCOUNTER — Other Ambulatory Visit: Payer: Self-pay | Admitting: Emergency Medicine

## 2016-04-23 NOTE — Telephone Encounter (Signed)
This was refilled in October with a plan for follow-up for further refills. I don't see where that has been discussed recently, including at visits with Dr. Everlene Farrier, and unfortunately we did not get to discuss the Robaxin at her last visit. I will go ahead and refill it for another month, but would like her to return to discuss reasons for use of that medication and plan.

## 2016-04-23 NOTE — Telephone Encounter (Signed)
To Dr. Carlota Raspberry for approval

## 2016-04-25 NOTE — Telephone Encounter (Signed)
Tried to call pt but VM not set up yet.

## 2016-05-25 ENCOUNTER — Other Ambulatory Visit: Payer: Self-pay | Admitting: Family Medicine

## 2016-05-28 NOTE — Telephone Encounter (Signed)
04/23/16 last refill #90 Last ov 02/2016

## 2016-05-29 NOTE — Telephone Encounter (Signed)
Refilled, but more info needed about use of this med, as it appears it is not just being used on PRN basis but about 3 times per day everyday. Please return to discuss this med in next month.

## 2016-06-02 NOTE — Telephone Encounter (Signed)
L/m Pt advised ov needed

## 2016-06-18 ENCOUNTER — Other Ambulatory Visit: Payer: Self-pay | Admitting: Family Medicine

## 2016-06-18 DIAGNOSIS — I10 Essential (primary) hypertension: Secondary | ICD-10-CM

## 2016-06-23 ENCOUNTER — Ambulatory Visit (INDEPENDENT_AMBULATORY_CARE_PROVIDER_SITE_OTHER): Payer: BLUE CROSS/BLUE SHIELD | Admitting: Family Medicine

## 2016-06-23 VITALS — BP 142/84 | HR 104 | Temp 98.0°F | Ht 65.0 in | Wt 278.6 lb

## 2016-06-23 DIAGNOSIS — M545 Low back pain, unspecified: Secondary | ICD-10-CM

## 2016-06-23 DIAGNOSIS — G8929 Other chronic pain: Secondary | ICD-10-CM | POA: Diagnosis not present

## 2016-06-23 DIAGNOSIS — E781 Pure hyperglyceridemia: Secondary | ICD-10-CM

## 2016-06-23 DIAGNOSIS — E119 Type 2 diabetes mellitus without complications: Secondary | ICD-10-CM | POA: Diagnosis not present

## 2016-06-23 DIAGNOSIS — E785 Hyperlipidemia, unspecified: Secondary | ICD-10-CM | POA: Diagnosis not present

## 2016-06-23 DIAGNOSIS — I10 Essential (primary) hypertension: Secondary | ICD-10-CM | POA: Diagnosis not present

## 2016-06-23 DIAGNOSIS — Z23 Encounter for immunization: Secondary | ICD-10-CM

## 2016-06-23 MED ORDER — METFORMIN HCL 1000 MG PO TABS: 1000.0000 mg | ORAL_TABLET | Freq: Two times a day (BID) | ORAL | 1 refills | Status: DC

## 2016-06-23 MED ORDER — METHOCARBAMOL 500 MG PO TABS: 500.0000 mg | ORAL_TABLET | Freq: Three times a day (TID) | ORAL | 1 refills | Status: DC

## 2016-06-23 MED ORDER — LOSARTAN POTASSIUM-HCTZ 100-25 MG PO TABS: 1.0000 | ORAL_TABLET | Freq: Every day | ORAL | 1 refills | Status: DC

## 2016-06-23 MED ORDER — ZOSTER VACCINE LIVE 19400 UNT/0.65ML ~~LOC~~ SUSR: 0.6500 mL | Freq: Once | SUBCUTANEOUS | 0 refills | Status: AC

## 2016-06-23 MED ORDER — AMLODIPINE BESYLATE 5 MG PO TABS: 5.0000 mg | ORAL_TABLET | Freq: Every day | ORAL | 1 refills | Status: DC

## 2016-06-23 MED ORDER — PRAVASTATIN SODIUM 40 MG PO TABS: 40.0000 mg | ORAL_TABLET | Freq: Every day | ORAL | 1 refills | Status: DC

## 2016-06-23 NOTE — Progress Notes (Deleted)
     Subjective:    Patient ID: Tammy Harris, female    DOB: [birth year], 66 y.o.   MRN: [id number]  Chief Complaint  Patient presents with  . Medication Refill  . Blood Work    PCP: Wendie Agreste, MD  HPI  This is a 66 y.o. female who is presenting with ***.   Review of Systems   Patient Active Problem List   Diagnosis Date Noted  . Morbid obesity ([place]) 08/15/2014  . Hypertension 03/30/2012  . Hyperlipidemia 03/30/2012  . Diabetes ([place]) 03/30/2012     Prior to Admission medications   Medication Sig Start Date End Date Taking? Authorizing Provider  amLODipine (NORVASC) 5 MG tablet Take 1 tablet (5 mg total) by mouth daily. 03/04/16  Yes Wendie Agreste, MD  aspirin 81 MG tablet Take 81 mg by mouth daily.   Yes Historical Provider, MD  fluconazole (DIFLUCAN) 150 MG tablet TAKE 1 TABLET (150 MG TOTAL) BY MOUTH ONCE. REPEAT IF NEEDED in one week 01/24/14  Yes [name], MD  fluticasone [facility]) 50 MCG/ACT nasal spray USE 2 SPRAYS INTO EACH NOSTRIL EVERY DAY 08/04/14  Yes [name], MD  losartan-hydrochlorothiazide (HYZAAR) 100-25 MG tablet TAKE ONE TABLET BY MOUTH ONCE DAILY 06/18/16  Yes Chelle Jeffery, PA-C  metFORMIN (GLUCOPHAGE) 1000 MG tablet Take 1 tablet (1,000 mg total) by mouth 2 (two) times daily with a meal. 03/04/16  Yes Wendie Agreste, MD  methocarbamol (ROBAXIN) 500 MG tablet TAKE ONE TABLET BY MOUTH THREE TIMES DAILY 05/29/16  Yes Wendie Agreste, MD  pravastatin (PRAVACHOL) 40 MG tablet Take 1 tablet (40 mg total) by mouth daily. 03/04/16  Yes Wendie Agreste, MD     Allergies  Allergen Reactions  . Ace Inhibitors     Patient developed a cough with use of ACE inhibitors.  . Sulfa Antibiotics       Objective:   Physical Exam BP (!) 142/84 (BP Location: Right Arm, Patient Position: Sitting, Cuff Size: Large)   Pulse (!) 104   Temp 98 F (36.7 C) (Oral)   Ht 5\' 5"  (1.651 m)   Wt 278 lb 9.6 oz (126.4 kg)   SpO2 98%   BMI 46.36 kg/m    Gen: NAD, alert, cooperative with exam, well-appearing HEENT: NCAT, EOMI, clear conjunctiva, oropharynx clear, supple neck CV: RRR, good S1/S2, no murmur, no edema, capillary refill brisk  Resp: CTABL, no wheezes, non-labored Abd: SNTND, BS present, no guarding or organomegaly Skin: no rashes, normal turgor  Neuro: no gross deficits.  Psych: good insight, alert and oriented      Assessment & Plan:   No problem-specific Assessment & Plan notes found for this encounter.

## 2016-06-23 NOTE — Patient Instructions (Addendum)
Schedule eye care appointment - once per year. Dentist appointment also recommended.   No change in metformin dose for now. I refilled your medications for 6 months as long as the A1c is controlled.  I would recommend meeting with a back specialist. I did refill your muscle relaxer for the next 6 months, but would like you to meet with a back specialist within that time to determine if other options would be better for treatment of your back.  Let me know when you're ready and I will refer you.  A few of my recommendations would be: Dr. Lynann Bologna at [facility] Dr. Rolena Infante at [facility] Dr. Ron Agee at [place].   Shingles vaccine printed to fill at your pharmacy. Pneumonia vaccine given today.    IF you received an x-ray today, you will receive an invoice from [facility]. Please contact [facility] at [phone] with questions or concerns regarding your invoice.   IF you received labwork today, you will receive an invoice from [facility]. Please contact [facility] at [phone] with questions or concerns regarding your invoice.   Our billing staff will not be able to assist you with questions regarding bills from these companies.  You will be contacted with the lab results as soon as they are available. The fastest way to get your results is to activate your My Chart account. Instructions are located on the last page of this paperwork. If you have not heard from [place] regarding the results in 2 weeks, please contact this office.

## 2016-06-23 NOTE — Progress Notes (Signed)
By signing my name below, I, [name], attest that this documentation has been prepared under the direction and in the presence of [name], MD.  Electronically Signed: [name], Medical Scribe. 06/23/16. 11:22 AM.  Subjective:    Patient ID: Tammy Harris, female    DOB: April 17, 1951, 66 y.o.   MRN: [id number]  HPI Chief Complaint  Patient presents with  . Medication Refill  . Blood Work    HPI Comments: Tammy Harris is a 66 y.o. female who presents to the [facility] for follow-up.  DM: She was continued on metformin 1000 mg BID. Pt is compliant with her medication and hasn't been checking her blood sugar at home. Pt hasn't seen an ophthalmologist or dentist in the past year but plans on scheduling an appt. She has a glucometer at home. Denies experiencing diarrhea or any acute side effects. Lab Results  Component Value Date   HGBA1C 6.8 (H) 03/04/2016   Lab Results  Component Value Date   MICROALBUR 0.7 03/04/2016   Hypercalcemia: 10.7 in Oct, recommended repeat testing in 6 weeks. She had a bone scan that was negative Oct 2016. Pt takes a multivitamin as well as Vit D.  HLD: On pravachol 40 mg QD. Denies experiencing myalgias or other acute side effects. Lab Results  Component Value Date   CHOL 174 03/04/2016   HDL 51 03/04/2016   LDLCALC 87 03/04/2016   TRIG 181 (H) 03/04/2016   CHOLHDL 3.4 03/04/2016   Lab Results  Component Value Date   ALT 42 (H) 03/04/2016   AST 28 03/04/2016   ALKPHOS 61 03/04/2016   BILITOT 0.4 03/04/2016   Breast CA: She is followed by [facility], Dr. Marcello Moores. She completed surgery and radiation therapy. She takes letrozole. Pt had a mammogram last year and she gets a annual breast CA exam. Cervical CA: Pt recently had a biopsy of her cervix.  Arthritis in her Back: She's had consistent back pain and it hasn't changed. Pain occurs when she has to bend over, or when she doesn't have lumbar  support while sitting. She notes her back occasionally "goes out", occurring twice in the past 6 months and the flares last a couple of days to a week. Pt takes robaxin TID for relief of her back pain - was originally rx by a back surgeon in 2008 and has been taking it this way for 10 years. Pt cut back on robaxin from 1 to no tabs in a week and by the time the weekend came, she could hardly walk. Pt had PT for her back in 2008 when she had her knee surgery - wasn't recommended a epidural spinal injection. She's prone to spurs and has spurs in her spine, shoulder, and knee. She repeatedly stated she didn't want back surgery. Denies numbness or weakness in her lower extremities, saddle anesthesia, and bowel/bladder incontinence.  Health Maintenance: Paper work provided on mammogram last year. Plans to return for physical in 6 months. She has a upcoming colonoscopy scheduled. Immunizations: Pt had her flu shot this year. Pt asked about shingles vaccine and lans on getting her PNA vaccine today. Immunization History  Administered Date(s) Administered  . Influenza Split 03/30/2012  . Influenza,inj,Quad PF,36+ Mos 01/11/2013, 12/20/2013, 03/04/2016  . Influenza-Unspecified 01/11/2015  . Pneumococcal Conjugate-13 08/15/2014  . Tdap 08/26/2011    Patient Active Problem List   Diagnosis Date Noted  . Morbid obesity ([place]) 08/15/2014  . Hypertension 03/30/2012  . Hyperlipidemia 03/30/2012  .  Diabetes ([place]) 03/30/2012   Past Medical History:  Diagnosis Date  . Cancer ([place]) 2015   BREAST   History reviewed. No pertinent surgical history. Allergies  Allergen Reactions  . Ace Inhibitors     Patient developed a cough with use of ACE inhibitors.  . Sulfa Antibiotics    Prior to Admission medications   Medication Sig Start Date End Date Taking? Authorizing Provider  amLODipine (NORVASC) 5 MG tablet Take 1 tablet (5 mg total) by mouth daily. 03/04/16  Yes Wendie Agreste, MD  aspirin 81 MG tablet  Take 81 mg by mouth daily.   Yes Historical Provider, MD  fluconazole (DIFLUCAN) 150 MG tablet TAKE 1 TABLET (150 MG TOTAL) BY MOUTH ONCE. REPEAT IF NEEDED in one week 01/24/14  Yes [name], MD  fluticasone [facility]) 50 MCG/ACT nasal spray USE 2 SPRAYS INTO EACH NOSTRIL EVERY DAY 08/04/14  Yes [name], MD  losartan-hydrochlorothiazide (HYZAAR) 100-25 MG tablet TAKE ONE TABLET BY MOUTH ONCE DAILY 06/18/16  Yes Chelle Jeffery, PA-C  metFORMIN (GLUCOPHAGE) 1000 MG tablet Take 1 tablet (1,000 mg total) by mouth 2 (two) times daily with a meal. 03/04/16  Yes Wendie Agreste, MD  methocarbamol (ROBAXIN) 500 MG tablet TAKE ONE TABLET BY MOUTH THREE TIMES DAILY 05/29/16  Yes Wendie Agreste, MD  pravastatin (PRAVACHOL) 40 MG tablet Take 1 tablet (40 mg total) by mouth daily. 03/04/16  Yes Wendie Agreste, MD   Social History   Social History  . Marital status: Unknown    Spouse name: N/A  . Number of children: N/A  . Years of education: N/A   Occupational History  . Not on file.   Social History Main Topics  . Smoking status: Never Smoker  . Smokeless tobacco: Never Used  . Alcohol use No  . Drug use: No  . Sexual activity: Not on file   Other Topics Concern  . Not on file   Social History Narrative  . No narrative on file   Review of Systems  Gastrointestinal: Negative for diarrhea.  Musculoskeletal: Positive for arthralgias and back pain. Negative for myalgias.  Neurological: Negative for weakness and numbness.    Objective:  Physical Exam  Constitutional: She appears well-developed and well-nourished. No distress.  HENT:  Head: Normocephalic and atraumatic.  Eyes: Conjunctivae are normal.  Neck: Neck supple.  Cardiovascular: Normal rate, regular rhythm and normal heart sounds.  Exam reveals no friction rub.   No murmur heard. Pulmonary/Chest: Effort normal. No respiratory distress. She has no wheezes. She has no rales.  Musculoskeletal:  No focal tenderness No  bony tenderness mid line Flexion 90 degrees Slight discomfort with right lateral flexion otherwise FROM  Neurological: She is alert.  Reflex Scores:      Patellar reflexes are 2+ on the right side and 2+ on the left side. Skin: Skin is warm and dry.  Psychiatric: She has a normal mood and affect. Her behavior is normal.  Nursing note and vitals reviewed.  BP (!) 142/84 (BP Location: Right Arm, Patient Position: Sitting, Cuff Size: Large)   Pulse (!) 104   Temp 98 F (36.7 C) (Oral)   Ht 5\' 5"  (1.651 m)   Wt 278 lb 9.6 oz (126.4 kg)   SpO2 98%   BMI 46.36 kg/m   Assessment & Plan:   Tammy Harris is a 66 y.o. female Left-sided low back pain without sciatica, unspecified chronicity - Plan: methocarbamol (ROBAXIN) 500 MG tablet, Chronic left-sided low  back pain without sciatica  - Long-standing low back pain and requirement for muscle relaxant 3 times per day. Discussed potential side effects of muscle relaxant, as well as there may be other options for treatment of her back pain besides this medication or surgery.   -She declined physical therapy at this time, declined referral to a specialist at this time, but did discuss that I would recommend that within the next 6 months.  - I agreed to refill her methocarbamol at same dose for now as she has been stable overall with that medication and without any new side effects. No red flags on exam or history.  Type 2 diabetes mellitus without complication, without long-term current use of insulin (HCC) - Plan: Hemoglobin A1C, metFORMIN (GLUCOPHAGE) 1000 MG tablet  - Check A1c, continue metformin same dose.  Hypercalcemia  - Mildly elevated, calcium repeated, plan on possible PTH if elevated.  Bone scan previously reassuring, with history of breast cancer.  Pure hyperglyceridemia - Plan: pravastatin (PRAVACHOL) 40 MG tablet Hyperlipidemia, unspecified hyperlipidemia type - Plan: Comprehensive metabolic panel, Lipid panel  - Check lipids,  continue pravastatin at same dose for now as tolerated.   Need for prophylactic vaccination against Streptococcus pneumoniae (pneumococcus) - Plan: Pneumococcal polysaccharide vaccine 23-valent greater than or equal to 2yo subcutaneous/IM  -Appears she has had a Prevnar but not Pneumovax. Pneumovax given with history of diabetes  Need for shingles vaccine - Plan: Zoster Vaccine Live, PF, (ZOSTAVAX) 16109 UNT/0.65ML injection  -Prescription printed for her to fill/given at pharmacy if not cost prohibitive.  Essential hypertension - Plan: losartan-hydrochlorothiazide (HYZAAR) 100-25 MG tablet, amLODipine (NORVASC) 5 MG tablet  - Borderline elevated in office, can monitor out of office, and rtc if elevated. Continue same dose of medication for now.   Meds ordered this encounter  Medications  . methocarbamol (ROBAXIN) 500 MG tablet    Sig: Take 1 tablet (500 mg total) by mouth 3 (three) times daily.    Dispense:  270 tablet    Refill:  1  . Zoster Vaccine Live, PF, (ZOSTAVAX) 60454 UNT/0.65ML injection    Sig: Inject 19,400 Units into the skin once.    Dispense:  1 each    Refill:  0  . pravastatin (PRAVACHOL) 40 MG tablet    Sig: Take 1 tablet (40 mg total) by mouth daily.    Dispense:  90 tablet    Refill:  1  . metFORMIN (GLUCOPHAGE) 1000 MG tablet    Sig: Take 1 tablet (1,000 mg total) by mouth 2 (two) times daily with a meal.    Dispense:  180 tablet    Refill:  1  . losartan-hydrochlorothiazide (HYZAAR) 100-25 MG tablet    Sig: Take 1 tablet by mouth daily.    Dispense:  90 tablet    Refill:  1  . amLODipine (NORVASC) 5 MG tablet    Sig: Take 1 tablet (5 mg total) by mouth daily.    Dispense:  90 tablet    Refill:  1   Patient Instructions   Schedule eye care appointment - once per year. Dentist appointment also recommended.   No change in metformin dose for now. I refilled your medications for 6 months as long as the A1c is controlled.  I would recommend meeting with a  back specialist. I did refill your muscle relaxer for the next 6 months, but would like you to meet with a back specialist within that time to determine if other options would be better  for treatment of your back.  Let me know when you're ready and I will refer you.  A few of my recommendations would be: Dr. Lynann Bologna at [facility] Dr. Rolena Infante at [facility] Dr. Ron Agee at [place].   Shingles vaccine printed to fill at your pharmacy. Pneumonia vaccine given today.    IF you received an x-ray today, you will receive an invoice from [facility]. Please contact [facility] at [phone] with questions or concerns regarding your invoice.   IF you received labwork today, you will receive an invoice from [place]. Please contact [facility] at [phone] with questions or concerns regarding your invoice.   Our billing staff will not be able to assist you with questions regarding bills from these companies.  You will be contacted with the lab results as soon as they are available. The fastest way to get your results is to activate your My Chart account. Instructions are located on the last page of this paperwork. If you have not heard from [place] regarding the results in 2 weeks, please contact this office.        I personally performed the services described in this documentation, which was scribed in my presence. The recorded information has been reviewed and considered for accuracy and completeness, addended by me as needed, and agree with information above.  Signed,   [name], MD [facility] [place].  06/26/16 12:31 PM

## 2016-06-24 LAB — COMPREHENSIVE METABOLIC PANEL
ALT: 44 IU/L — ABNORMAL HIGH (ref 0–32)
AST: 27 IU/L (ref 0–40)
Albumin/Globulin Ratio: 1.8 (ref 1.2–2.2)
Albumin: 4.2 g/dL (ref 3.6–4.8)
Alkaline Phosphatase: 73 IU/L (ref 39–117)
BUN/Creatinine Ratio: 22 (ref 12–28)
BUN: 15 mg/dL (ref 8–27)
Bilirubin Total: 0.3 mg/dL (ref 0.0–1.2)
CALCIUM: 10.8 mg/dL — AB (ref 8.7–10.3)
CO2: 23 mmol/L (ref 18–29)
CREATININE: 0.67 mg/dL (ref 0.57–1.00)
Chloride: 100 mmol/L (ref 96–106)
GFR calc Af Amer: 108 mL/min/{1.73_m2} (ref >59)
GFR, EST NON AFRICAN AMERICAN: 94 mL/min/{1.73_m2} (ref >59)
GLUCOSE: 138 mg/dL — AB (ref 65–99)
Globulin, Total: 2.4 g/dL (ref 1.5–4.5)
Potassium: 4.5 mmol/L (ref 3.5–5.2)
Sodium: 142 mmol/L (ref 134–144)
Total Protein: 6.6 g/dL (ref 6.0–8.5)

## 2016-06-24 LAB — LIPID PANEL
CHOL/HDL RATIO: 3.6 ratio (ref 0.0–4.4)
CHOLESTEROL TOTAL: 171 mg/dL (ref 100–199)
HDL: 47 mg/dL (ref >39)
LDL CALC: 90 mg/dL (ref 0–99)
TRIGLYCERIDES: 171 mg/dL — AB (ref 0–149)
VLDL Cholesterol Cal: 34 mg/dL (ref 5–40)

## 2016-06-24 LAB — HEMOGLOBIN A1C
ESTIMATED AVERAGE GLUCOSE: 157 mg/dL
HEMOGLOBIN A1C: 7.1 % — AB (ref 4.8–5.6)

## 2016-06-26 NOTE — Addendum Note (Signed)
Addended by: Gari Crown D on: 06/26/2016 06:05 PM   Modules accepted: Orders

## 2016-06-27 ENCOUNTER — Other Ambulatory Visit: Payer: BLUE CROSS/BLUE SHIELD

## 2016-06-28 LAB — PTH, INTACT AND CALCIUM
CALCIUM: 10 mg/dL (ref 8.7–10.3)
PTH: 23 pg/mL (ref 15–65)

## 2016-07-24 ENCOUNTER — Ambulatory Visit: Payer: BLUE CROSS/BLUE SHIELD | Admitting: Family Medicine

## 2016-09-04 ENCOUNTER — Ambulatory Visit: Payer: BLUE CROSS/BLUE SHIELD | Admitting: Family Medicine

## 2016-11-19 IMAGING — US US  BREAST BX W/ LOC DEV 1ST LESION IMG BX SPEC US GUIDE*R*
1 series · 9 of 9 positions shown · non-contrast
Comparison: Previous exam(s).

ADDENDUM:
Pathology revealed grade II invasive mammary carcinoma in the right
breast, and grade II invasive ductal carcinoma and ductal carcinoma
in situ with lymphovascular invasion in the left breast. This was
found to be concordant by Dr. Eventon Ubisi. Pathology was
discussed with the patient by telephone. She reported doing well
after the biopsies. Post biopsy instructions and care were reviewed
and her questions were answered. The patient is out of network for
[REDACTED] and I have contacted Dr. Edisher Rejon, her
primary care provider. His office will arrange surgical referral. My
number was provided to his office for assistance, and to the patient
for additional concerns and questions.

Pathology results;ts reported by Megue Seon RN, BSN on December 04, 2014.
CLINICAL DATA: Right breast 7 o'clock subareolar mass.
EXAM:
ULTRASOUND GUIDED RIGHT BREAST CORE NEEDLE BIOPSY

[Series 1: us breast bx w/ loc dev 1st lesion img bx spec us  · 0.06mm/px · 9 of 9 slices shown]
[im 1/9]
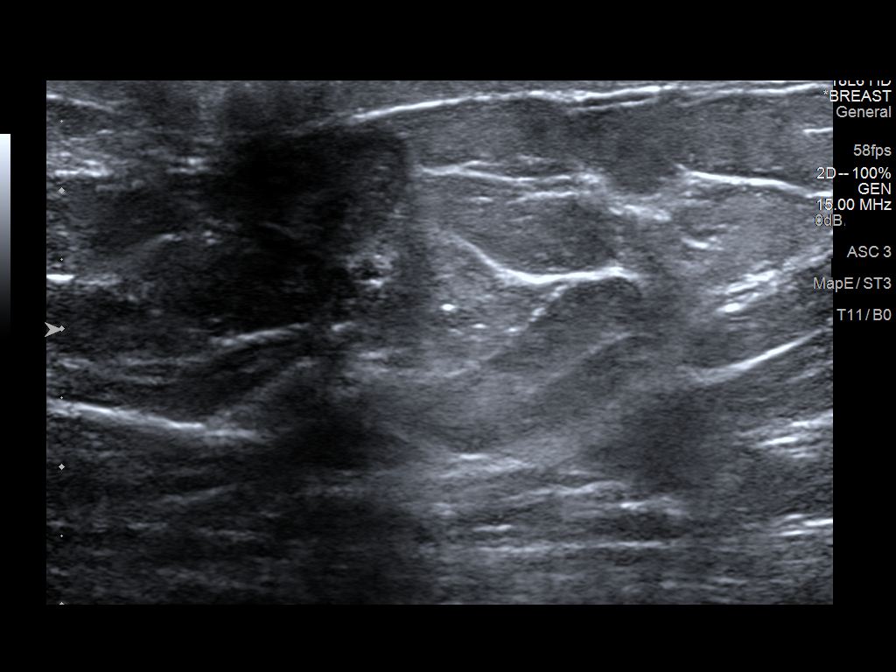
[im 2/9]
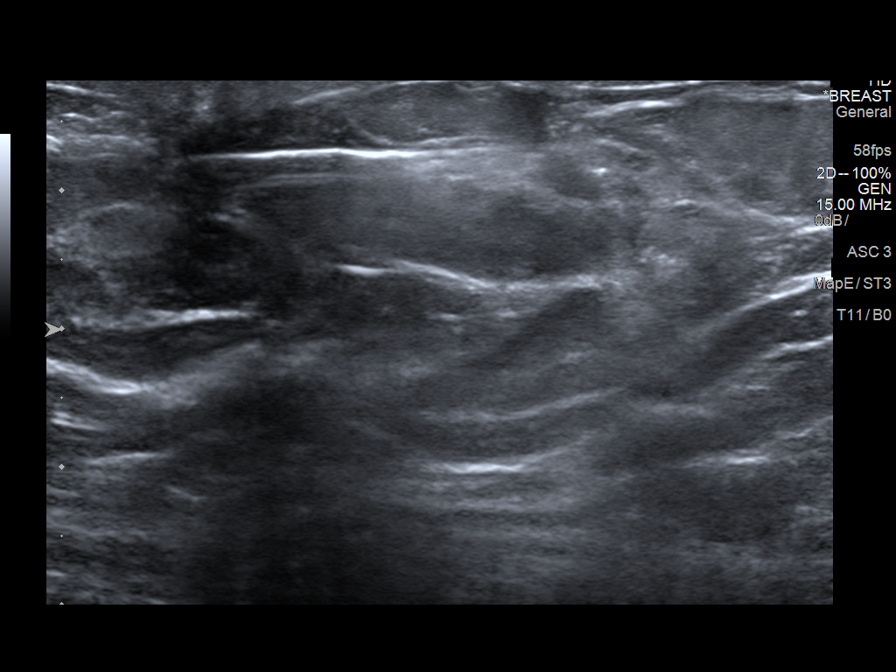
[im 3/9]
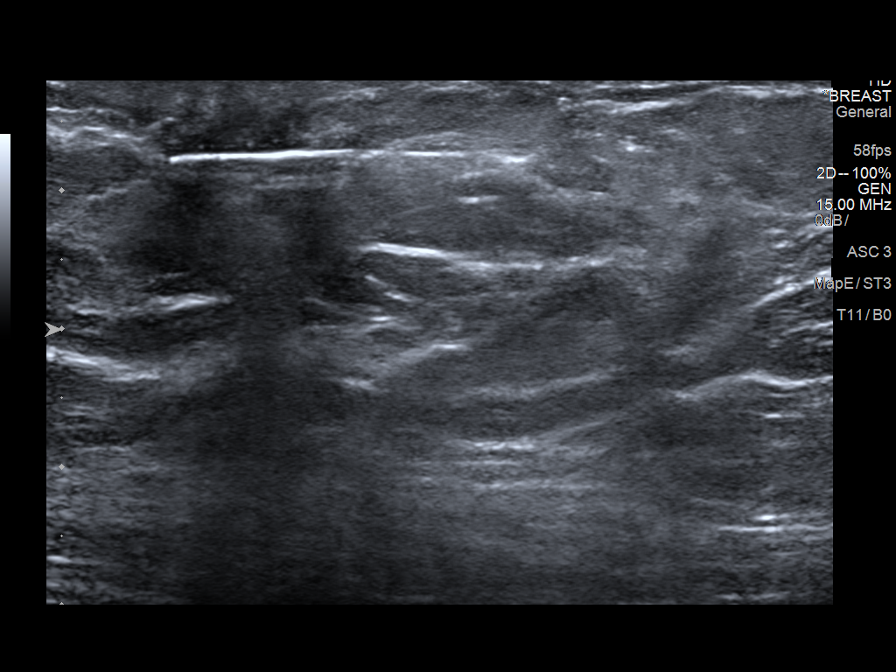
[im 4/9]
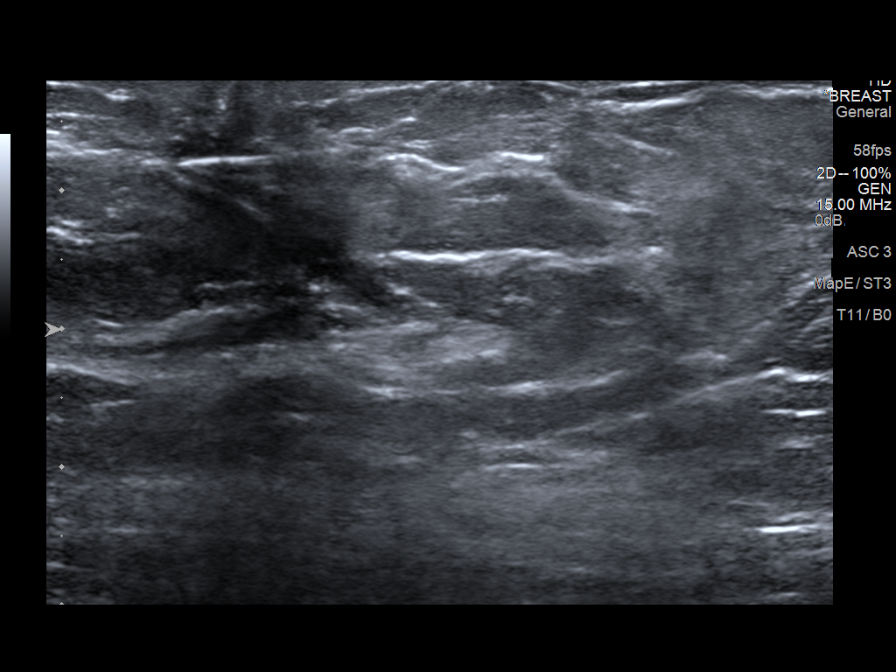
[im 5/9]
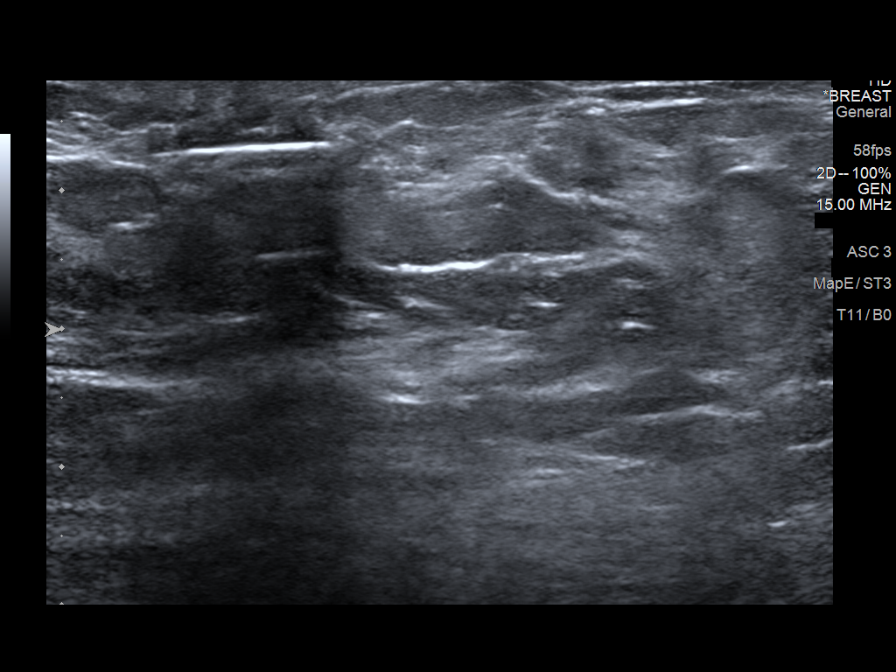
[im 6/9]
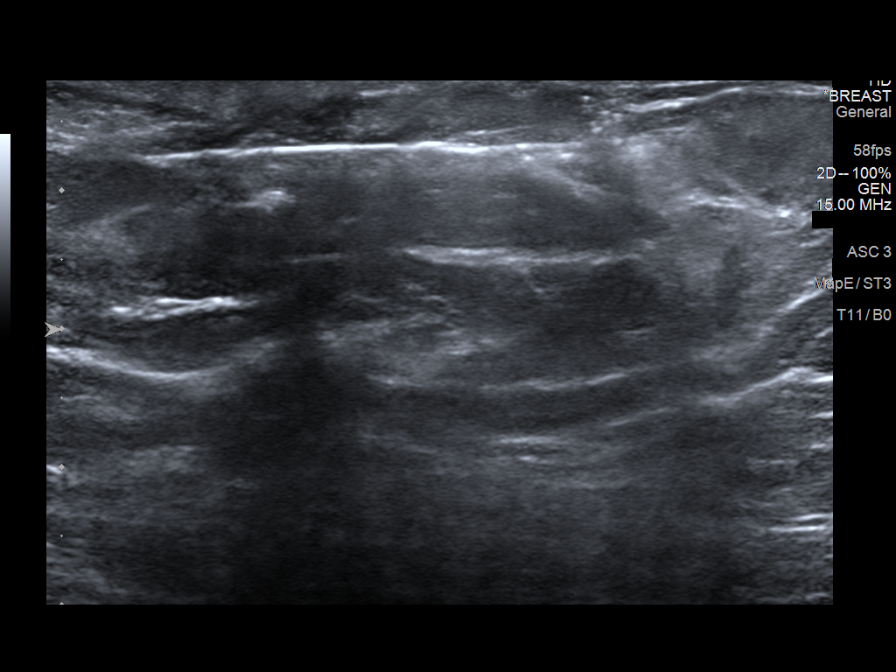
[im 7/9]
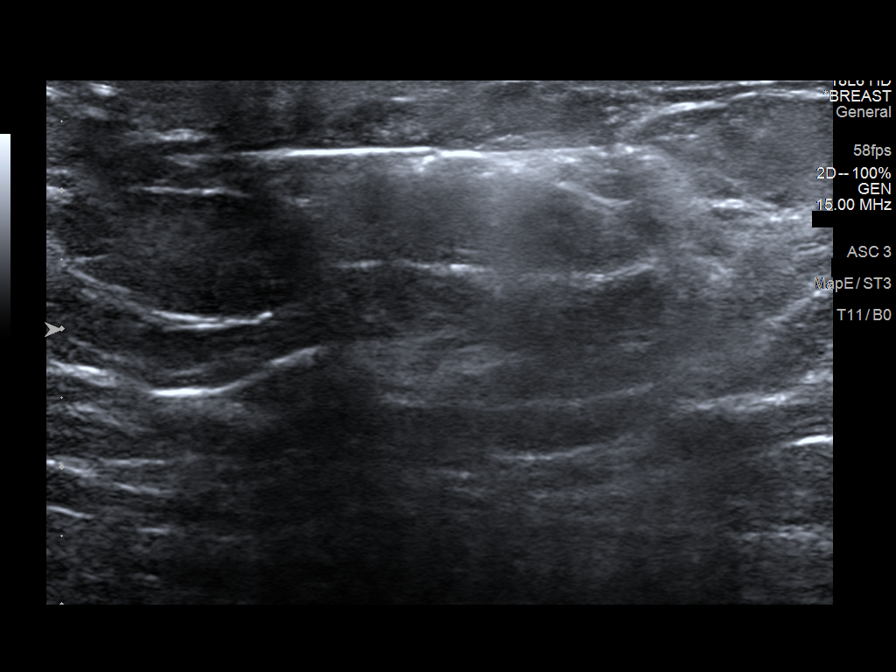
[im 8/9]
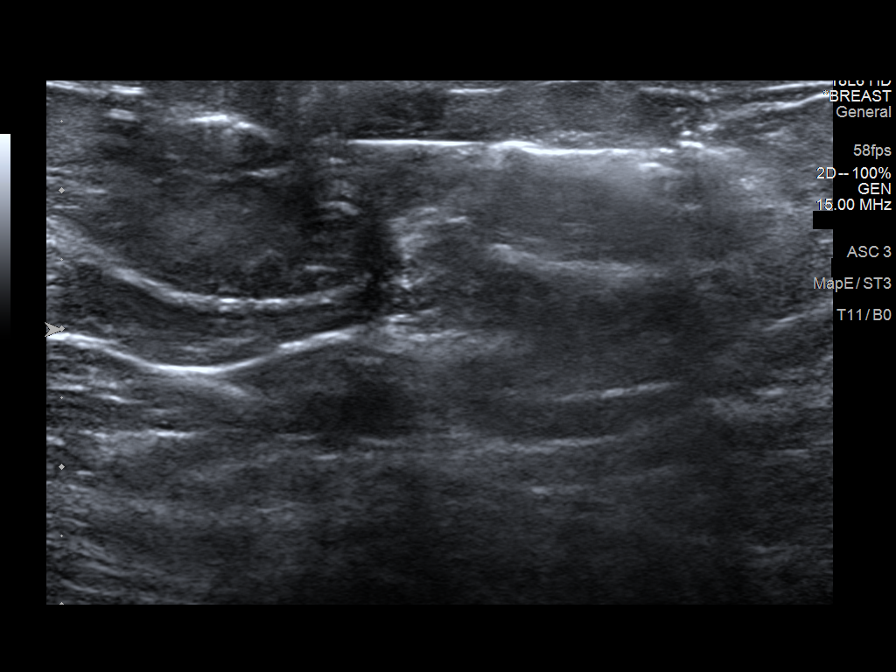
[im 9/9]
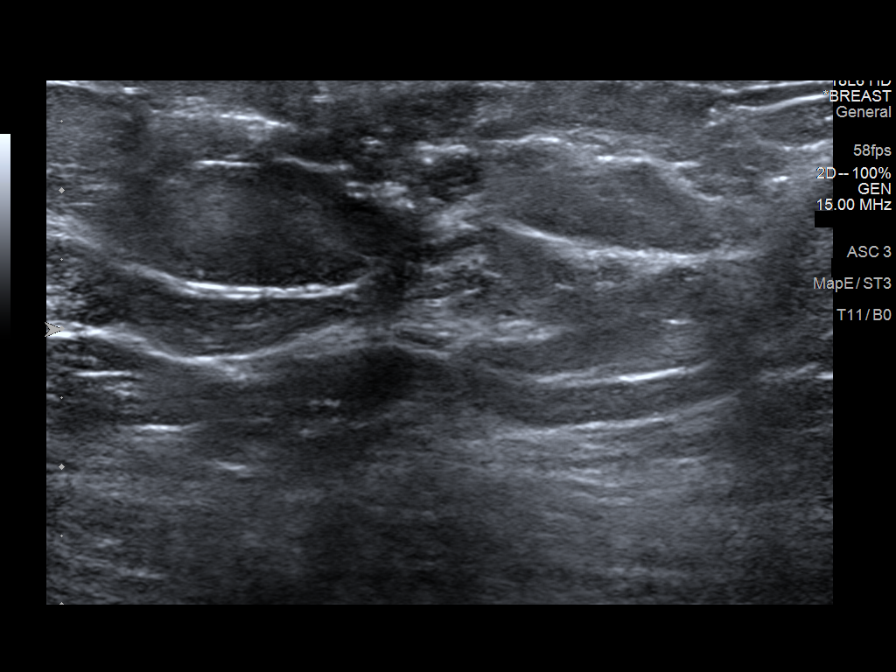

[9 of 9 positions shown; findings below may reference images not displayed]



Using sterile technique and 1% Lidocaine as local anesthetic, under
direct ultrasound visualization, a 14 gauge Urrehman device was
used to perform biopsy of the right breast using a inferior
approach. At the conclusion of the procedure a ribbon shaped tissue
marker clip was deployed into the biopsy cavity. Follow up 2 view
mammogram was performed and dictated separately.
IMPRESSION: Ultrasound guided biopsy of right subareolar mass. No apparent
complications.

## 2016-12-24 ENCOUNTER — Ambulatory Visit: Payer: Self-pay | Admitting: Family Medicine

## 2016-12-25 ENCOUNTER — Ambulatory Visit: Payer: BLUE CROSS/BLUE SHIELD | Admitting: Family Medicine

## 2016-12-26 ENCOUNTER — Ambulatory Visit (INDEPENDENT_AMBULATORY_CARE_PROVIDER_SITE_OTHER): Payer: BLUE CROSS/BLUE SHIELD | Admitting: Family Medicine

## 2016-12-26 ENCOUNTER — Encounter: Payer: Self-pay | Admitting: Family Medicine

## 2016-12-26 VITALS — BP 118/82 | HR 87 | Temp 98.9°F | Ht 65.0 in | Wt 273.4 lb

## 2016-12-26 DIAGNOSIS — I10 Essential (primary) hypertension: Secondary | ICD-10-CM | POA: Diagnosis not present

## 2016-12-26 DIAGNOSIS — E785 Hyperlipidemia, unspecified: Secondary | ICD-10-CM

## 2016-12-26 DIAGNOSIS — E119 Type 2 diabetes mellitus without complications: Secondary | ICD-10-CM

## 2016-12-26 DIAGNOSIS — M545 Low back pain, unspecified: Secondary | ICD-10-CM

## 2016-12-26 DIAGNOSIS — C50911 Malignant neoplasm of unspecified site of right female breast: Secondary | ICD-10-CM

## 2016-12-26 DIAGNOSIS — C50912 Malignant neoplasm of unspecified site of left female breast: Secondary | ICD-10-CM

## 2016-12-26 DIAGNOSIS — G8929 Other chronic pain: Secondary | ICD-10-CM | POA: Diagnosis not present

## 2016-12-26 DIAGNOSIS — E781 Pure hyperglyceridemia: Secondary | ICD-10-CM

## 2016-12-26 DIAGNOSIS — R519 Headache, unspecified: Secondary | ICD-10-CM

## 2016-12-26 DIAGNOSIS — R51 Headache: Secondary | ICD-10-CM

## 2016-12-26 LAB — CBC
HEMATOCRIT: 43.1 % (ref 35.0–45.0)
Hemoglobin: 14.2 g/dL (ref 11.7–15.5)
MCH: 28.8 pg (ref 27.0–33.0)
MCHC: 32.9 g/dL (ref 32.0–36.0)
MCV: 87.4 fL (ref 80.0–100.0)
MPV: 10.4 fL (ref 7.5–12.5)
PLATELETS: 327 10*3/uL (ref 140–400)
RBC: 4.93 MIL/uL (ref 3.80–5.10)
RDW: 13.7 % (ref 11.0–15.0)
WBC: 10.5 10*3/uL (ref 3.8–10.8)

## 2016-12-26 MED ORDER — METHOCARBAMOL 500 MG PO TABS: 500.0000 mg | ORAL_TABLET | Freq: Three times a day (TID) | ORAL | 2 refills | Status: DC

## 2016-12-26 MED ORDER — LOSARTAN POTASSIUM-HCTZ 100-25 MG PO TABS: 1.0000 | ORAL_TABLET | Freq: Every day | ORAL | 3 refills | Status: DC

## 2016-12-26 MED ORDER — PRAVASTATIN SODIUM 40 MG PO TABS: 40.0000 mg | ORAL_TABLET | Freq: Every day | ORAL | 3 refills | Status: DC

## 2016-12-26 MED ORDER — METFORMIN HCL 1000 MG PO TABS: 1000.0000 mg | ORAL_TABLET | Freq: Two times a day (BID) | ORAL | 3 refills | Status: DC

## 2016-12-26 MED ORDER — AMLODIPINE BESYLATE 5 MG PO TABS: 5.0000 mg | ORAL_TABLET | Freq: Every day | ORAL | 3 refills | Status: DC

## 2016-12-26 NOTE — Assessment & Plan Note (Signed)
S: controlled on Amlodipine 5 mg, losartan-hctz 100-25mg .  BP Readings from Last 3 Encounters:  12/26/16 118/82  06/23/16 (!) 142/84  03/04/16 132/90  A/P: We discussed blood pressure goal of <140/90. Continue current meds

## 2016-12-26 NOTE — Patient Instructions (Addendum)
Health Maintenance Due  Topic Date Due  . OPHTHALMOLOGY EXAM - please get updated eye exam and have them send [place] a copy 04/05/1963  . HIV Screening - declines 04/04/1968  . PAP SMEAR -Sign release of information at the check out desk for record 08/26/2014  . COLONOSCOPY - you agreed to this in January. Requests Dr. Hilarie Fredrickson.  06/27/2015  . INFLUENZA VACCINE - get in the fall 12/10/2016   Please stop by lab before you go  No changes today- refilled medication

## 2016-12-26 NOTE — Assessment & Plan Note (Addendum)
Encouraged need for healthy eating, regular exercise (hard with arthritic issues), weight loss. She has had some steady weight loss over last year down 5 lbs in 6 months and 7 lbs in 10 months. Since 2016- down 20 lbs overall.  Wt Readings from Last 3 Encounters:  12/26/16 273 lb 6.4 oz (124 kg)  06/23/16 278 lb 9.6 oz (126.4 kg)  03/04/16 280 lb (127 kg)

## 2016-12-26 NOTE — Assessment & Plan Note (Signed)
S: mild poorly controlled. On last check with metformin 1g BID Lab Results  Component Value Date   HGBA1C 7.1 (H) 06/23/2016   HGBA1C 6.8 (H) 03/04/2016   HGBA1C 7.7 08/02/2015   A/P: update a1c, discussed importance of weight loss

## 2016-12-26 NOTE — Addendum Note (Signed)
Addended by: Tomi Likens on: 12/26/2016 04:06 PM   Modules accepted: Orders

## 2016-12-26 NOTE — Assessment & Plan Note (Signed)
Continue follow up with [facility]  From their records 12/2014 surgery "1. Synchronous Right and Left Stage 1 breast cancers Staging: Pretreatment H&P reassuring Local therapy Surgery - bilateral lumpectomy, Dr. Elisha Headland Radiation therapy - completed Systemic treatment - Begin letrozole 04/2015 and is tolerating without difficulty; DEXA normal 03/2015. Will continue letrozole 2.5 mg/day.  Surveillance - per Dr. Alvan Dame. Exam today. As above, advised patient to monitor area of her concern in the left breast and contact our clinic with any growth, changes or concerns. "

## 2016-12-26 NOTE — Assessment & Plan Note (Signed)
Issues Before 2008-no fall or injury. Has had MRI lumbar spine- DDD and spurs. Flares at times- methocarbamol at times.

## 2016-12-26 NOTE — Progress Notes (Signed)
Phone: [phone]  Subjective:  Patient presents today to establish care.  Prior patient of [facility]. Chief complaint-noted.   See problem oriented charting  The following were reviewed and entered/updated in epic: Past Medical History:  Diagnosis Date  . Arthritis   . Cancer ([place]) 2015   BREAST  . Diabetes mellitus without complication ([place])   . Frequent headaches    long term issues. saw neurology in the past. rarely takes rx- excedrin if bad  . Hyperlipidemia   . Hypertension    Patient Active Problem List   Diagnosis Date Noted  . Breast cancer ([place]) 12/13/2014    Priority: High  . Morbid obesity ([place]) 08/15/2014    Priority: High  . Diabetes ([place]) 03/30/2012    Priority: High  . Hypertension 03/30/2012    Priority: Medium  . Hyperlipidemia 03/30/2012    Priority: Medium  . Low back pain 12/26/2016    Priority: Low  . Frequent headaches    Past Surgical History:  Procedure Laterality Date  . BREAST SURGERY     lumpectomy bilateral, biopsy precceeded  . CESAREAN SECTION     x2  . ELBOW BURSA SURGERY Right   . FOOT SURGERY     bone spurs   . NASAL POLYP EXCISION    . TOTAL KNEE ARTHROPLASTY Bilateral 2008    Family History  Problem Relation Age of Onset  . Hyperlipidemia Mother        43 in 2018  . Atrial fibrillation Mother   . Hypertension Father   . Parkinson's disease Father   . Healthy Sister   . Hypertension Brother        overweight  . Prostate cancer Brother   . Breast cancer Maternal Grandmother        mets- led to death. great grandmother had stroke young.   Marland Kitchen Heart disease Paternal Grandmother        pacemaker  . Diabetes Maternal Uncle   . Hypothyroidism Sister        after removal    Medications- reviewed and updated Current Outpatient Prescriptions  Medication Sig Dispense Refill  . amLODipine (NORVASC) 5 MG tablet Take 1 tablet (5 mg total) by mouth daily. 90 tablet 3  . aspirin 81 MG tablet Take 81 mg by mouth  daily.    . fluticasone (FLONASE) 50 MCG/ACT nasal spray USE 2 SPRAYS INTO EACH NOSTRIL EVERY DAY 48 g 2  . letrozole (FEMARA) 2.5 MG tablet Take 2.5 mg by mouth daily.    Marland Kitchen losartan-hydrochlorothiazide (HYZAAR) 100-25 MG tablet Take 1 tablet by mouth daily. 90 tablet 3  . metFORMIN (GLUCOPHAGE) 1000 MG tablet Take 1 tablet (1,000 mg total) by mouth 2 (two) times daily with a meal. 180 tablet 3  . methocarbamol (ROBAXIN) 500 MG tablet Take 1 tablet (500 mg total) by mouth 3 (three) times daily. 270 tablet 2  . pravastatin (PRAVACHOL) 40 MG tablet Take 1 tablet (40 mg total) by mouth daily. 90 tablet 3   No current facility-administered medications for this visit.     Allergies-reviewed and updated Allergies  Allergen Reactions  . Ace Inhibitors     Patient developed a cough with use of ACE inhibitors.  . Sulfa Antibiotics     Social History   Social History  . Marital status: Unknown    Spouse name: N/A  . Number of children: N/A  . Years of education: N/A   Social History Main Topics  . Smoking status: Never  Smoker  . Smokeless tobacco: Never Used  . Alcohol use No  . Drug use: No  . Sexual activity: Yes   Other Topics Concern  . None   Social History Narrative   Married 1975. 2 sons. 4 grandkids- all local (21 to 10 in 2018)      Pianist in church.    2 years of college.       Hobbies: enjoys watching football (since radiation)- enjoys NFL, music, movies, pool in back yard, grandkids    ROS--Full ROS was completed Review of Systems  Constitutional: Negative for chills and fever.  HENT: Negative for hearing loss and tinnitus.   Eyes: Negative for blurred vision and double vision.  Respiratory: Negative for cough and hemoptysis.   Cardiovascular: Negative for chest pain and palpitations.  Gastrointestinal: Negative for heartburn and nausea.  Genitourinary: Negative for dysuria and urgency.  Musculoskeletal: Positive for back pain. Negative for falls.  Skin:  Negative for itching and rash.  Neurological: Negative for tingling and tremors.  Endo/Heme/Allergies: Negative for polydipsia. Does not bruise/bleed easily.  Psychiatric/Behavioral: Negative for hallucinations and substance abuse.   Objective: BP 118/82 (BP Location: Right Arm, Patient Position: Sitting, Cuff Size: Large)   Pulse 87   Temp 98.9 F (37.2 C) (Oral)   Ht 5\' 5"  (1.651 m)   Wt 273 lb 6.4 oz (124 kg)   SpO2 96%   BMI 45.50 kg/m  Gen: NAD, resting comfortably HEENT: Mucous membranes are moist. Oropharynx normal. TM normal. Eyes: sclera and lids normal, PERRLA Neck: no thyromegaly, no cervical lymphadenopathy CV: RRR no murmurs rubs or gallops Lungs: CTAB no crackles, wheeze, rhonchi Abdomen: soft/nontender/nondistended/normal bowel sounds. No rebound or guarding. Morbid obesity Ext: no edema Skin: warm, dry Neuro: 5/5 strength in upper and lower extremities, normal gait, normal reflexes  Assessment/Plan:  Health Maintenance Due  Topic Date Due  . OPHTHALMOLOGY EXAM - please get updated eye exam and have them send [place] a copy 04/05/1963  . HIV Screening - declines 04/04/1968  . PAP SMEAR -Sign release of information at the check out desk for record 08/26/2014  . COLONOSCOPY - you agreed to this in january 06/27/2015  . INFLUENZA VACCINE - get in the fall 12/10/2016  LMP 2008.   Hypertension S: controlled on Amlodipine 5 mg, losartan-hctz 100-25mg .  BP Readings from Last 3 Encounters:  12/26/16 118/82  06/23/16 (!) 142/84  03/04/16 132/90  A/P: We discussed blood pressure goal of <140/90. Continue current meds   Hyperlipidemia S: reasonably controlled on pravastatin 40mg . No myalgias.  Lab Results  Component Value Date   CHOL 171 06/23/2016   HDL 47 06/23/2016   LDLCALC 90 06/23/2016   TRIG 171 (H) 06/23/2016   CHOLHDL 3.6 06/23/2016   A/P: continue current rx for LDL at least 100   Diabetes (HCC) S: mild poorly controlled. On last check with  metformin 1g BID Lab Results  Component Value Date   HGBA1C 7.1 (H) 06/23/2016   HGBA1C 6.8 (H) 03/04/2016   HGBA1C 7.7 08/02/2015   A/P: update a1c, discussed importance of weight loss   Morbid obesity ([place]) Encouraged need for healthy eating, regular exercise (hard with arthritic issues), weight loss. She has had some steady weight loss over last year down 5 lbs in 6 months and 7 lbs in 10 months. Since 2016- down 20 lbs overall.  Wt Readings from Last 3 Encounters:  12/26/16 273 lb 6.4 oz (124 kg)  06/23/16 278 lb 9.6 oz (126.4 kg)  03/04/16 280 lb (127 kg)     Breast cancer ([place]) Continue follow up with [facility]  From their records 12/2014 surgery "1. Synchronous Right and Left Stage 1 breast cancers Staging: Pretreatment H&P reassuring Local therapy Surgery - bilateral lumpectomy, Dr. Elisha Headland Radiation therapy - completed Systemic treatment - Begin letrozole 04/2015 and is tolerating without difficulty; DEXA normal 03/2015. Will continue letrozole 2.5 mg/day.  Surveillance - per Dr. Alvan Dame. Exam today. As above, advised patient to monitor area of her concern in the left breast and contact our clinic with any growth, changes or concerns. "  Low back pain Issues Before 2008-no fall or injury. Has had MRI lumbar spine- DDD and spurs. Flares at times- methocarbamol at times.    No Follow-up on file.   Orders Placed This Encounter  Procedures  . Hemoglobin A1c    [place]  . CBC    [place]  . Comprehensive metabolic panel    [place]    Meds ordered this encounter  Medications  . letrozole (FEMARA) 2.5 MG tablet    Sig: Take 2.5 mg by mouth daily.  Marland Kitchen losartan-hydrochlorothiazide (HYZAAR) 100-25 MG tablet    Sig: Take 1 tablet by mouth daily.    Dispense:  90 tablet    Refill:  3  . amLODipine (NORVASC) 5 MG tablet    Sig: Take 1 tablet (5 mg total) by mouth daily.    Dispense:  90 tablet    Refill:  3  . methocarbamol (ROBAXIN) 500 MG tablet    Sig:  Take 1 tablet (500 mg total) by mouth 3 (three) times daily.    Dispense:  270 tablet    Refill:  2  . pravastatin (PRAVACHOL) 40 MG tablet    Sig: Take 1 tablet (40 mg total) by mouth daily.    Dispense:  90 tablet    Refill:  3  . metFORMIN (GLUCOPHAGE) 1000 MG tablet    Sig: Take 1 tablet (1,000 mg total) by mouth 2 (two) times daily with a meal.    Dispense:  180 tablet    Refill:  3    Return precautions advised.  Garret Reddish, MD

## 2016-12-26 NOTE — Assessment & Plan Note (Signed)
S: reasonably controlled on pravastatin 40mg . No myalgias.  Lab Results  Component Value Date   CHOL 171 06/23/2016   HDL 47 06/23/2016   LDLCALC 90 06/23/2016   TRIG 171 (H) 06/23/2016   CHOLHDL 3.6 06/23/2016   A/P: continue current rx for LDL at least 100

## 2016-12-26 NOTE — Addendum Note (Signed)
Addended by: Marin Olp on: 12/26/2016 03:30 PM   Modules accepted: Orders

## 2016-12-27 LAB — COMPREHENSIVE METABOLIC PANEL
ALK PHOS: 67 U/L (ref 33–130)
ALT: 30 U/L — ABNORMAL HIGH (ref 6–29)
AST: 23 U/L (ref 10–35)
Albumin: 4.3 g/dL (ref 3.6–5.1)
BUN: 12 mg/dL (ref 7–25)
CALCIUM: 10.6 mg/dL — AB (ref 8.6–10.4)
CHLORIDE: 100 mmol/L (ref 98–110)
CO2: 22 mmol/L (ref 20–32)
Creat: 0.73 mg/dL (ref 0.50–0.99)
GLUCOSE: 106 mg/dL — AB (ref 65–99)
POTASSIUM: 4.2 mmol/L (ref 3.5–5.3)
Sodium: 137 mmol/L (ref 135–146)
Total Bilirubin: 0.5 mg/dL (ref 0.2–1.2)
Total Protein: 7.1 g/dL (ref 6.1–8.1)

## 2016-12-27 LAB — HEMOGLOBIN A1C
Hgb A1c MFr Bld: 6.8 % — ABNORMAL HIGH (ref <5.7)
Mean Plasma Glucose: 148 mg/dL

## 2017-02-06 LAB — HM MAMMOGRAPHY

## 2017-09-30 LAB — HM DEXA SCAN: HM Dexa Scan: NORMAL

## 2018-01-01 ENCOUNTER — Other Ambulatory Visit: Payer: Self-pay | Admitting: Family Medicine

## 2018-01-01 DIAGNOSIS — I10 Essential (primary) hypertension: Secondary | ICD-10-CM

## 2018-01-28 ENCOUNTER — Encounter: Payer: Self-pay | Admitting: Family Medicine

## 2018-01-28 ENCOUNTER — Ambulatory Visit (INDEPENDENT_AMBULATORY_CARE_PROVIDER_SITE_OTHER): Payer: BLUE CROSS/BLUE SHIELD | Admitting: Family Medicine

## 2018-01-28 VITALS — BP 124/86 | HR 94 | Temp 98.2°F | Ht 65.0 in | Wt 277.2 lb

## 2018-01-28 DIAGNOSIS — M545 Low back pain, unspecified: Secondary | ICD-10-CM

## 2018-01-28 DIAGNOSIS — Z1211 Encounter for screening for malignant neoplasm of colon: Secondary | ICD-10-CM

## 2018-01-28 DIAGNOSIS — I10 Essential (primary) hypertension: Secondary | ICD-10-CM | POA: Diagnosis not present

## 2018-01-28 DIAGNOSIS — Z23 Encounter for immunization: Secondary | ICD-10-CM | POA: Diagnosis not present

## 2018-01-28 DIAGNOSIS — E785 Hyperlipidemia, unspecified: Secondary | ICD-10-CM | POA: Diagnosis not present

## 2018-01-28 DIAGNOSIS — E119 Type 2 diabetes mellitus without complications: Secondary | ICD-10-CM | POA: Diagnosis not present

## 2018-01-28 LAB — LIPID PANEL
CHOLESTEROL: 165 mg/dL (ref 0–200)
HDL: 50.4 mg/dL (ref >39.00)
LDL Cholesterol: 85 mg/dL (ref 0–99)
NonHDL: 114.69
Total CHOL/HDL Ratio: 3
Triglycerides: 148 mg/dL (ref 0.0–149.0)
VLDL: 29.6 mg/dL (ref 0.0–40.0)

## 2018-01-28 LAB — POCT GLYCOSYLATED HEMOGLOBIN (HGB A1C): Hemoglobin A1C: 7 % — AB (ref 4.0–5.6)

## 2018-01-28 MED ORDER — AMLODIPINE BESYLATE 5 MG PO TABS: 5.0000 mg | ORAL_TABLET | Freq: Every day | ORAL | 3 refills | Status: DC

## 2018-01-28 MED ORDER — METHOCARBAMOL 500 MG PO TABS: 500.0000 mg | ORAL_TABLET | Freq: Three times a day (TID) | ORAL | 2 refills | Status: DC

## 2018-01-28 MED ORDER — PRAVASTATIN SODIUM 40 MG PO TABS: 40.0000 mg | ORAL_TABLET | Freq: Every day | ORAL | 3 refills | Status: DC

## 2018-01-28 MED ORDER — METFORMIN HCL 1000 MG PO TABS: 1000.0000 mg | ORAL_TABLET | Freq: Two times a day (BID) | ORAL | 3 refills | Status: DC

## 2018-01-28 MED ORDER — LOSARTAN POTASSIUM-HCTZ 100-25 MG PO TABS: 1.0000 | ORAL_TABLET | Freq: Every day | ORAL | 3 refills | Status: DC

## 2018-01-28 NOTE — Assessment & Plan Note (Signed)
S:  controlled on last check on metformin 1g BID. Need to update a1c. Weight unfortunately up from last year 4 lbs. Has morbid obesity based off BMI >40. Has instead of diet tried to focus on the next 5 lbs- felt like doing well but has been really constipated and hard to eat what she wants- weight goes down with good BM.  Lab Results  Component Value Date   HGBA1C 7.0 (A) 01/28/2018   HGBA1C 6.8 (H) 12/26/2016   HGBA1C 7.1 (H) 06/23/2016  A/P: updated a1c still at goal of 7 or less. Continue metformin and push for weight loss- discussed healthy eating and regular exercise. Discussed doing colonoscopy due to constipation- she does have IBS history though- if cologuard positive will need colonoscopy. Declined nutrition counseling with Inda Coke, PA and RD.

## 2018-01-28 NOTE — Assessment & Plan Note (Signed)
S: controlled on  amlodipine 5 mg and losartan hctz 100-25mg  BP Readings from Last 3 Encounters:  01/28/18 124/86  12/26/16 118/82  06/23/16 (!) 142/84  A/P: We discussed blood pressure goal of <140/90. Continue current meds

## 2018-01-28 NOTE — Assessment & Plan Note (Signed)
S:  controlled on last check on pravastatin 40mg  with LDL at least under 100 Lab Results  Component Value Date   CHOL 171 06/23/2016   HDL 47 06/23/2016   LDLCALC 90 06/23/2016   TRIG 171 (H) 06/23/2016   CHOLHDL 3.6 06/23/2016   A/P: update full lipid panel

## 2018-01-28 NOTE — Progress Notes (Signed)
Subjective:  Tammy Harris is a 67 y.o. year old very pleasant female patient who presents for/with See problem oriented charting ROS- No chest pain or shortness of breath. Still having issues with headaches (life long issues). No blurry vision. Continued issues with back pain- robaxin helps  Past Medical History-  Patient Active Problem List   Diagnosis Date Noted  . Breast cancer ([place]) 12/13/2014    Priority: High  . Morbid obesity ([place]) 08/15/2014    Priority: High  . Diabetes ([place]) 03/30/2012    Priority: High  . Frequent headaches     Priority: Medium  . Hypertension 03/30/2012    Priority: Medium  . Hyperlipidemia 03/30/2012    Priority: Medium  . Low back pain 12/26/2016    Priority: Low    Medications- reviewed and updated Current Outpatient Medications  Medication Sig Dispense Refill  . amLODipine (NORVASC) 5 MG tablet TAKE 1 TABLET BY MOUTH ONCE DAILY 30 tablet 0  . aspirin 81 MG tablet Take 81 mg by mouth daily.    . fluticasone (FLONASE) 50 MCG/ACT nasal spray USE 2 SPRAYS INTO EACH NOSTRIL EVERY DAY 48 g 2  . letrozole (FEMARA) 2.5 MG tablet Take 2.5 mg by mouth daily.    Marland Kitchen losartan-hydrochlorothiazide (HYZAAR) 100-25 MG tablet Take 1 tablet by mouth daily. 90 tablet 3  . metFORMIN (GLUCOPHAGE) 1000 MG tablet Take 1 tablet (1,000 mg total) by mouth 2 (two) times daily with a meal. 180 tablet 3  . methocarbamol (ROBAXIN) 500 MG tablet Take 1 tablet (500 mg total) by mouth 3 (three) times daily. 270 tablet 2  . pravastatin (PRAVACHOL) 40 MG tablet Take 1 tablet (40 mg total) by mouth daily. 90 tablet 3   Objective: BP 124/86 (BP Location: Left Arm, Patient Position: Sitting, Cuff Size: Large)   Pulse 94   Temp 98.2 F (36.8 C) (Oral)   Ht 5\' 5"  (1.651 m)   Wt 277 lb 3.2 oz (125.7 kg)   SpO2 96%   BMI 46.13 kg/m  Gen: NAD, resting comfortably CV: RRR no murmurs rubs or gallops Lungs: CTAB no crackles, wheeze, rhonchi Abdomen:  soft/nontender/nondistended/normal bowel sounds. Morbid obesity Ext: no edema Skin: warm, dry  Diabetic Foot Exam - Simple   Simple Foot Form Diabetic Foot exam was performed with the following findings:  Yes 01/28/2018 11:17 AM  Visual Inspection No deformities, no ulcerations, no other skin breakdown bilaterally:  Yes Sensation Testing Intact to touch and monofilament testing bilaterally:  Yes Pulse Check Posterior Tibialis and Dorsalis pulse intact bilaterally:  Yes Comments    Assessment/Plan:  Other notes: 1.reviewed labs from [facility] on 09/30/17 and CBC and CMP were normal so we will skip those today.    Hypertension S: controlled on  amlodipine 5 mg and losartan hctz 100-25mg  BP Readings from Last 3 Encounters:  01/28/18 124/86  12/26/16 118/82  06/23/16 (!) 142/84  A/P: We discussed blood pressure goal of <140/90. Continue current meds  Hyperlipidemia S:  controlled on last check on pravastatin 40mg  with LDL at least under 100 Lab Results  Component Value Date   CHOL 171 06/23/2016   HDL 47 06/23/2016   LDLCALC 90 06/23/2016   TRIG 171 (H) 06/23/2016   CHOLHDL 3.6 06/23/2016   A/P: update full lipid panel  Diabetes ([place]) S:  controlled on last check on metformin 1g BID. Need to update a1c. Weight unfortunately up from last year 4 lbs. Has morbid obesity based off BMI >40. Has instead of  diet tried to focus on the next 5 lbs- felt like doing well but has been really constipated and hard to eat what she wants- weight goes down with good BM.  Lab Results  Component Value Date   HGBA1C 7.0 (A) 01/28/2018   HGBA1C 6.8 (H) 12/26/2016   HGBA1C 7.1 (H) 06/23/2016  A/P: updated a1c still at goal of 7 or less. Continue metformin and push for weight loss- discussed healthy eating and regular exercise. Discussed doing colonoscopy due to constipation- she does have IBS history though- if cologuard positive will need colonoscopy. Declined nutrition counseling with Inda Coke, PA and RD.    Return in about 6 months (around 07/29/2018) for follow up- or sooner if needed.  Lab/Order associations:   FASTING Type 2 diabetes mellitus without complication, without long-term current use of insulin ([place]) - Plan: POCT glycosylated hemoglobin (Hb A1C), metFORMIN (GLUCOPHAGE) 1000 MG tablet  Essential hypertension - Plan: losartan-hydrochlorothiazide (HYZAAR) 100-25 MG tablet, amLODipine (NORVASC) 5 MG tablet  Hyperlipidemia, unspecified hyperlipidemia type - Plan: Lipid panel, pravastatin (PRAVACHOL) 40 MG tablet  Colon cancer screening - Plan: Cologuard  Need for prophylactic vaccination and inoculation against influenza - Plan: Flu Vaccine QUAD 36+ mos IM  Left-sided low back pain without sciatica, unspecified chronicity - Plan: methocarbamol (ROBAXIN) 500 MG tablet  Meds ordered this encounter  Medications  . losartan-hydrochlorothiazide (HYZAAR) 100-25 MG tablet    Sig: Take 1 tablet by mouth daily.    Dispense:  90 tablet    Refill:  3  . amLODipine (NORVASC) 5 MG tablet    Sig: Take 1 tablet (5 mg total) by mouth daily.    Dispense:  90 tablet    Refill:  3  . methocarbamol (ROBAXIN) 500 MG tablet    Sig: Take 1 tablet (500 mg total) by mouth 3 (three) times daily.    Dispense:  270 tablet    Refill:  2  . pravastatin (PRAVACHOL) 40 MG tablet    Sig: Take 1 tablet (40 mg total) by mouth daily.    Dispense:  90 tablet    Refill:  3  . metFORMIN (GLUCOPHAGE) 1000 MG tablet    Sig: Take 1 tablet (1,000 mg total) by mouth 2 (two) times daily with a meal.    Dispense:  180 tablet    Refill:  3   Return precautions advised.  Garret Reddish, MD

## 2018-01-28 NOTE — Patient Instructions (Addendum)
Health Maintenance Due  Topic Date Due  . OPHTHALMOLOGY EXAM - please get this updated and have them send [place] a copy 04/05/1961  . PAP SMEAR - please schedule this - shes on our system so will show up 08/26/2014  . COLONOSCOPY -Roselyn Reef will get you set up for cologuard given no history polyps or family history of colon cancer 06/27/2015  . FOOT EXAM - normal today 03/04/2017  . INFLUENZA VACCINE - today 12/10/2017  . MAMMOGRAM - scheduled in a week- we can see this on care everywhere when we see you next time 01/17/2018   Please stop by lab before you go

## 2018-02-02 LAB — COLOGUARD: Cologuard: NEGATIVE

## 2018-02-08 LAB — HM MAMMOGRAPHY

## 2018-02-09 ENCOUNTER — Encounter: Payer: Self-pay | Admitting: Family Medicine

## 2018-02-10 ENCOUNTER — Telehealth: Payer: Self-pay | Admitting: *Deleted

## 2018-02-10 NOTE — Telephone Encounter (Signed)
Copied from [place] [id number]. Topic: General - Other >> Feb 10, 2018 11:51 AM Ivar Drape wrote: Reason for CRM:  Patient stated she received a telephone call concerning her Cologuard test results.  Please return call

## 2018-02-11 ENCOUNTER — Encounter: Payer: Self-pay | Admitting: Family Medicine

## 2018-02-12 NOTE — Telephone Encounter (Signed)
Respond via My Chart to patient

## 2018-04-01 DIAGNOSIS — C50912 Malignant neoplasm of unspecified site of left female breast: Secondary | ICD-10-CM | POA: Diagnosis not present

## 2018-04-01 DIAGNOSIS — M2550 Pain in unspecified joint: Secondary | ICD-10-CM | POA: Diagnosis not present

## 2018-04-01 DIAGNOSIS — R51 Headache: Secondary | ICD-10-CM | POA: Diagnosis not present

## 2018-04-01 DIAGNOSIS — R232 Flushing: Secondary | ICD-10-CM | POA: Diagnosis not present

## 2018-04-01 DIAGNOSIS — N951 Menopausal and female climacteric states: Secondary | ICD-10-CM | POA: Diagnosis not present

## 2018-04-01 DIAGNOSIS — Z79811 Long term (current) use of aromatase inhibitors: Secondary | ICD-10-CM | POA: Diagnosis not present

## 2018-04-01 DIAGNOSIS — Z170 Estrogen receptor positive status [ER+]: Secondary | ICD-10-CM | POA: Diagnosis not present

## 2018-04-01 DIAGNOSIS — C50911 Malignant neoplasm of unspecified site of right female breast: Secondary | ICD-10-CM | POA: Diagnosis not present

## 2018-04-01 DIAGNOSIS — Z923 Personal history of irradiation: Secondary | ICD-10-CM | POA: Diagnosis not present

## 2018-04-06 ENCOUNTER — Other Ambulatory Visit: Payer: Self-pay | Admitting: Family Medicine

## 2018-04-06 DIAGNOSIS — I10 Essential (primary) hypertension: Secondary | ICD-10-CM

## 2018-04-06 NOTE — Telephone Encounter (Signed)
Copied from [place] [id number]. Topic: Quick Communication - Rx Refill/Question >> Apr 06, 2018 11:47 AM Leward Quan A wrote: Medication: losartan-hydrochlorothiazide (HYZAAR) 100-25 MG tablet   Has the patient contacted their pharmacy? Yes.   (Agent: If no, request that the patient contact the pharmacy for the refill.) (Agent: If yes, when and what did the pharmacy advise?)  Preferred Pharmacy (with phone number or street name[facility] [place], [place] [phone] (Phone) [phone] (Fax)    Agent: Please be advised that RX refills may take up to 3 business days. We ask that you follow-up with your pharmacy.

## 2018-04-06 NOTE — Telephone Encounter (Signed)
Requested medication (s) are due for refill today: no  Requested medication (s) are on the active medication list: yes  Last refill:  01/28/18 refills remain  Future visit scheduled: yes  Notes to clinic:  Requesting new pharmacy                           Preferred Pharmacy (with phone number or street name): [facility] [place], [address]  [phone] (Phone) [phone] (Fax) Requested Prescriptions  Pending Prescriptions Disp Refills   losartan-hydrochlorothiazide (HYZAAR) 100-25 MG tablet 90 tablet 3    Sig: Take 1 tablet by mouth daily.     Cardiovascular: ARB + Diuretic Combos Failed - 04/06/2018  2:03 PM      Failed - K in normal range and within 180 days    Potassium  Date Value Ref Range Status  12/26/2016 4.2 3.5 - 5.3 mmol/L Final         Failed - Na in normal range and within 180 days    Sodium  Date Value Ref Range Status  12/26/2016 137 135 - 146 mmol/L Final  06/23/2016 142 134 - 144 mmol/L Final         Failed - Cr in normal range and within 180 days    Creat  Date Value Ref Range Status  12/26/2016 0.73 0.50 - 0.99 mg/dL Final    Comment:      For patients > or = 65 years of age: The upper reference limit for Creatinine is approximately 13% higher for people identified as African-American.            Failed - Ca in normal range and within 180 days    Calcium  Date Value Ref Range Status  12/26/2016 10.6 (H) 8.6 - 10.4 mg/dL Final         Passed - Patient is not pregnant      Passed - Last BP in normal range    BP Readings from Last 1 Encounters:  01/28/18 124/86         Passed - Valid encounter within last 6 months    Recent Outpatient Visits          2 months ago Essential hypertension   [place], Brayton Mars, MD   1 year ago Essential hypertension   [facility] at [place], MD   1 year ago Left-sided low back pain without sciatica,  unspecified chronicity   [facility], Ranell Patrick, MD   2 years ago Type 2 diabetes mellitus without complication, without long-term current use of insulin [facility])   [facility], Ranell Patrick, MD   2 years ago Type 2 diabetes mellitus without complication, without long-term current use of insulin [facility])   [facility], MD      Future Appointments            In 3 months Hunter, Brayton Mars, MD [place], [facility]

## 2018-04-06 NOTE — Telephone Encounter (Signed)
I am confused- didn't we give her a years worth on 01/28/18?

## 2018-04-07 MED ORDER — LOSARTAN POTASSIUM-HCTZ 100-25 MG PO TABS: 1.0000 | ORAL_TABLET | Freq: Every day | ORAL | 3 refills | Status: DC

## 2018-04-07 NOTE — Telephone Encounter (Signed)
Previous prescription was sent to [facility]. This is a mail order request and likely less expensive for her.

## 2018-04-12 ENCOUNTER — Telehealth: Payer: Self-pay | Admitting: Family Medicine

## 2018-04-12 NOTE — Telephone Encounter (Signed)
MEDICATION:  losartan-hydrochlorothiazide (HYZAAR) 100-25 MG tablet PHARMACY:   [facility]  [place], [place] (Phone) [phone] (Fax)    IS THIS A 90 DAY SUPPLY : Y  IS PATIENT OUT OF MEDICATION: N  IF NOT; HOW MUCH IS LEFT:   LAST APPOINTMENT DATE: @11 /26/2019  NEXT APPOINTMENT DATE:@3 /19/2020  OTHER COMMENTS: [facility]'s [facility]. Please advise.    **Let patient know to contact pharmacy at the end of the day to make sure medication is ready. **  ** Please notify patient to allow 48-72 hours to process**  **Encourage patient to contact the pharmacy for refills or they can request refills through [facility]**

## 2018-04-13 ENCOUNTER — Other Ambulatory Visit: Payer: Self-pay

## 2018-04-13 MED ORDER — LOSARTAN POTASSIUM 100 MG PO TABS: 100.0000 mg | ORAL_TABLET | Freq: Every day | ORAL | 3 refills | Status: DC

## 2018-04-13 MED ORDER — HYDROCHLOROTHIAZIDE 25 MG PO TABS: 25.0000 mg | ORAL_TABLET | Freq: Every day | ORAL | 3 refills | Status: DC

## 2018-04-13 NOTE — Telephone Encounter (Signed)
See note

## 2018-04-13 NOTE — Telephone Encounter (Signed)
Medications sent to pharmacy as requested seperately

## 2018-04-13 NOTE — Telephone Encounter (Signed)
Pts husband states that [facility] does not have the below med in stock but stated for him to call to see if the meds can be ordered in 2 separate meds and [facility]? Please advise.

## 2018-04-27 ENCOUNTER — Other Ambulatory Visit: Payer: Self-pay | Admitting: Family Medicine

## 2018-04-27 DIAGNOSIS — I10 Essential (primary) hypertension: Secondary | ICD-10-CM

## 2018-04-27 DIAGNOSIS — M545 Low back pain, unspecified: Secondary | ICD-10-CM

## 2018-04-27 MED ORDER — AMLODIPINE BESYLATE 5 MG PO TABS: 5.0000 mg | ORAL_TABLET | Freq: Every day | ORAL | 3 refills | Status: DC

## 2018-04-27 NOTE — Telephone Encounter (Signed)
Requested medication (s) are due for refill today: yes  Requested medication (s) are on the active medication list: yes  Last refill:  01/28/18  Future visit scheduled: yes  Notes to clinic:  Not delegated   Requested Prescriptions  Pending Prescriptions Disp Refills   methocarbamol (ROBAXIN) 500 MG tablet 270 tablet 2    Sig: Take 1 tablet (500 mg total) by mouth 3 (three) times daily.     Not Delegated - Analgesics:  Muscle Relaxants Failed - 04/27/2018  1:12 PM      Failed - This refill cannot be delegated      Passed - Valid encounter within last 6 months    Recent Outpatient Visits          2 months ago Essential hypertension   [place], Brayton Mars, MD   1 year ago Essential hypertension   Therapist, music at [place], MD   1 year ago Left-sided low back pain without sciatica, unspecified chronicity   [facility], Ranell Patrick, MD   2 years ago Type 2 diabetes mellitus without complication, without long-term current use of insulin [facility])   [facility], Ranell Patrick, MD   2 years ago Type 2 diabetes mellitus without complication, without long-term current use of insulin [facility])   [facility], MD      Future Appointments            In 3 months Y[place] Brayton Mars, MD [facility] [place], [facility]         Signed Prescriptions Disp Refills   amLODipine (NORVASC) 5 MG tablet 90 tablet 3    Sig: Take 1 tablet (5 mg total) by mouth daily.     Cardiovascular:  Calcium Channel Blockers Passed - 04/27/2018  1:12 PM      Passed - Last BP in normal range    BP Readings from Last 1 Encounters:  01/28/18 124/86         Passed - Valid encounter within last 6 months    Recent Outpatient Visits          2 months ago Essential hypertension   [place], Brayton Mars, MD   1 year ago Essential hypertension   Therapist, music at  [place], MD   1 year ago Left-sided low back pain without sciatica, unspecified chronicity   [facility], Ranell Patrick, MD   2 years ago Type 2 diabetes mellitus without complication, without long-term current use of insulin [facility])   [facility], Ranell Patrick, MD   2 years ago Type 2 diabetes mellitus without complication, without long-term current use of insulin [facility])   [facility], MD      Future Appointments            In 3 months Hunter, Brayton Mars, MD [place], [facility]

## 2018-04-27 NOTE — Telephone Encounter (Signed)
Requested Prescriptions  Pending Prescriptions Disp Refills  . amLODipine (NORVASC) 5 MG tablet 90 tablet 3    Sig: Take 1 tablet (5 mg total) by mouth daily.     Cardiovascular:  Calcium Channel Blockers Passed - 04/27/2018  1:12 PM      Passed - Last BP in normal range    BP Readings from Last 1 Encounters:  01/28/18 124/86         Passed - Valid encounter within last 6 months    Recent Outpatient Visits          2 months ago Essential hypertension   [place], Brayton Mars, MD   1 year ago Essential hypertension   Therapist, music at [place], MD   1 year ago Left-sided low back pain without sciatica, unspecified chronicity   [facility], Ranell Patrick, MD   2 years ago Type 2 diabetes mellitus without complication, without long-term current use of insulin [facility])   [facility], Ranell Patrick, MD   2 years ago Type 2 diabetes mellitus without complication, without long-term current use of insulin [facility])   [facility], MD      Future Appointments            In 3 months Yong Channel Brayton Mars, MD [place], [facility]         . methocarbamol (ROBAXIN) 500 MG tablet 270 tablet 2    Sig: Take 1 tablet (500 mg total) by mouth 3 (three) times daily.     Not Delegated - Analgesics:  Muscle Relaxants Failed - 04/27/2018  1:12 PM      Failed - This refill cannot be delegated      Passed - Valid encounter within last 6 months    Recent Outpatient Visits          2 months ago Essential hypertension   [place], Brayton Mars, MD   1 year ago Essential hypertension   Therapist, music at [place], MD   1 year ago Left-sided low back pain without sciatica, unspecified chronicity   [facility], Ranell Patrick, MD   2 years ago Type 2 diabetes mellitus without complication, without long-term current  use of insulin [facility])   [facility], Ranell Patrick, MD   2 years ago Type 2 diabetes mellitus without complication, without long-term current use of insulin [facility])   [facility], MD      Future Appointments            In 3 months Hunter, Brayton Mars, MD [place], [facility]

## 2018-04-27 NOTE — Telephone Encounter (Signed)
Copied from [place] [id number]. Topic: Quick Communication - Rx Refill/Question >> Apr 27, 2018  1:02 PM Leward Quan A wrote: Medication: amLODipine (NORVASC) 5 MG tablet, methocarbamol (ROBAXIN) 500 MG tablet   Has the patient contacted their pharmacy? Yes.   (Agent: If no, request that the patient contact the pharmacy for the refill.) (Agent: If yes, when and what did the pharmacy advise?)  Preferred Pharmacy (with phone number or street name): [facility] [place], [place] [phone] (Phone) [phone] (Fax)    Agent: Please be advised that RX refills may take up to 3 business days. We ask that you follow-up with your pharmacy.

## 2018-04-27 NOTE — Telephone Encounter (Signed)
See note

## 2018-04-28 MED ORDER — METHOCARBAMOL 500 MG PO TABS: 500.0000 mg | ORAL_TABLET | Freq: Three times a day (TID) | ORAL | 2 refills | Status: DC

## 2018-04-28 NOTE — Telephone Encounter (Signed)
Patient called in and everything needs [facility]

## 2018-04-28 NOTE — Telephone Encounter (Signed)
Methocarbamol  Last OV 01/28/18 Last refill 01/28/18 #270/2 Next OV 07/29/18  Looks like last Rx was sent to [facility], new request is from [facility] [facility]. Will need to cancel rx at [place] if pt is requesting future Rxs to [facility].   Called pt and left VM to call the office.

## 2018-04-28 NOTE — Telephone Encounter (Signed)
See note

## 2018-04-28 NOTE — Telephone Encounter (Signed)
Rx sent [facility] today, called [facility] and left VM to cancel remaining refills.

## 2018-05-04 ENCOUNTER — Other Ambulatory Visit: Payer: Self-pay

## 2018-05-04 DIAGNOSIS — E785 Hyperlipidemia, unspecified: Secondary | ICD-10-CM

## 2018-05-04 MED ORDER — PRAVASTATIN SODIUM 40 MG PO TABS: 40.0000 mg | ORAL_TABLET | Freq: Every day | ORAL | 3 refills | Status: DC

## 2018-05-18 ENCOUNTER — Other Ambulatory Visit: Payer: Self-pay | Admitting: Family Medicine

## 2018-05-18 DIAGNOSIS — E119 Type 2 diabetes mellitus without complications: Secondary | ICD-10-CM

## 2018-05-18 MED ORDER — METFORMIN HCL 1000 MG PO TABS: 1000.0000 mg | ORAL_TABLET | Freq: Two times a day (BID) | ORAL | 0 refills | Status: DC

## 2018-05-18 MED ORDER — TIZANIDINE HCL 2 MG PO TABS: 2.0000 mg | ORAL_TABLET | Freq: Three times a day (TID) | ORAL | 1 refills | Status: DC | PRN

## 2018-05-18 NOTE — Telephone Encounter (Signed)
I sent in tizanidine as an alternate. Please let her know.   Thanks, Garret Reddish

## 2018-05-18 NOTE — Telephone Encounter (Signed)
Patient called stating that she has received a letter in the mail that states in order for the medication methocarbamol (ROBAXIN) 500 MG tablet to be filled through [facility], a letter of medical necessity is needed from PCP. Please advise.    [facility]:  [phone] (Phone) [phone] (Fax)

## 2018-05-18 NOTE — Telephone Encounter (Signed)
Copied from [place] [id number]. Topic: Quick Communication - Rx Refill/Question >> May 18, 2018 12:01 PM Sheran Luz wrote: Medication:metFORMIN (GLUCOPHAGE) 1000 MG tablet  Patient is requesting a refill of this medication.   Has the patient contacted their pharmacy? Yes, Patient was advised to contact office.   Preferred Pharmacy (with phone number or street name):[facility] [place], [place] [address]  [phone] (Phone) [phone] (Fax)

## 2018-05-18 NOTE — Telephone Encounter (Signed)
See note

## 2018-05-18 NOTE — Telephone Encounter (Signed)
We received the same letter. I have given the letter to Dr. Yong Channel to review and possibly change her medication as they offer a list of alternatives

## 2018-05-18 NOTE — Addendum Note (Signed)
Addended by: Marin Olp on: 05/18/2018 07:32 PM   Modules accepted: Orders

## 2018-05-18 NOTE — Telephone Encounter (Signed)
Requested Prescriptions  Pending Prescriptions Disp Refills  . metFORMIN (GLUCOPHAGE) 1000 MG tablet 180 tablet 0    Sig: Take 1 tablet (1,000 mg total) by mouth 2 (two) times daily with a meal.     Endocrinology:  Diabetes - Biguanides Failed - 05/18/2018 12:04 PM      Failed - Cr in normal range and within 360 days    Creat  Date Value Ref Range Status  12/26/2016 0.73 0.50 - 0.99 mg/dL Final    Comment:      For patients > or = 66 years of age: The upper reference limit for Creatinine is approximately 13% higher for people identified as African-American.            Failed - eGFR in normal range and within 360 days    GFR, Est African American  Date Value Ref Range Status  03/04/2016 85 >=60 mL/min Final   GFR calc Af Amer  Date Value Ref Range Status  06/23/2016 108 >59 mL/min/1.73 Final   GFR, Est Non African American  Date Value Ref Range Status  03/04/2016 74 >=60 mL/min Final   GFR calc non Af Amer  Date Value Ref Range Status  06/23/2016 94 >59 mL/min/1.73 Final         Passed - HBA1C is between 0 and 7.9 and within 180 days    Hemoglobin A1C  Date Value Ref Range Status  01/28/2018 7.0 (A) 4.0 - 5.6 % Final   Hgb A1c MFr Bld  Date Value Ref Range Status  12/26/2016 6.8 (H) <5.7 % Final    Comment:      For someone without known diabetes, a hemoglobin A1c value of 6.5% or greater indicates that they may have diabetes and this should be confirmed with a follow-up test.   For someone with known diabetes, a value <7% indicates that their diabetes is well controlled and a value greater than or equal to 7% indicates suboptimal control. A1c targets should be individualized based on duration of diabetes, age, comorbid conditions, and other considerations.   Currently, no consensus exists for use of hemoglobin A1c for diagnosis of diabetes for children.            Passed - Valid encounter within last 6 months    Recent Outpatient Visits          3  months ago Essential hypertension   [facility] [place], Brayton Mars, MD   1 year ago Essential hypertension   Therapist, music at [place], MD   1 year ago Left-sided low back pain without sciatica, unspecified chronicity   [facility], Ranell Patrick, MD   2 years ago Type 2 diabetes mellitus without complication, without long-term current use of insulin [facility])   [facility], Ranell Patrick, MD   2 years ago Type 2 diabetes mellitus without complication, without long-term current use of insulin [facility])   [facility], MD      Future Appointments            In 2 months [place], Brayton Mars, MD [place], [facility]

## 2018-05-24 NOTE — Telephone Encounter (Signed)
Patients husband called in and stated they were unaware of medication change.

## 2018-05-24 NOTE — Telephone Encounter (Signed)
See note

## 2018-05-25 NOTE — Telephone Encounter (Signed)
Patient's husband, Shanon Brow, calling back requesting to speak with Dr. Ansel Bong nurse. Shanon Brow states that patient was unaware of medication change and specifically wanted to note that patient would like to stay on Methocarbamol, If possible. Shanon Brow is requesting a call back as soon as possible.

## 2018-05-27 NOTE — Telephone Encounter (Signed)
Tammy Harris (Key: [id number])   This request has received a Favorable outcome. Please note any additional information provided by [facility] at the bottom of your screen. You will also receive a faxed copy of the determination.   OK to resend prescription for Methocarbamol?

## 2018-05-27 NOTE — Telephone Encounter (Signed)
Prior Authorization submitted to Cover My Meds for Methocarbamol 500 mg tablets TID.  Domenica Reamer (Key: [id number])

## 2018-05-27 NOTE — Telephone Encounter (Signed)
Called and spoke with husband Shanon Brow and Kathaleen both regarding change of medication. They have changed insurance companies and their new insurance company will not cover the Robaxin. The insurance company sent the same letter to our office and the patient with alternatives they will cover. Shanon Brow states he did a side by side comparison of Robaxin and tizanidine. He states that, "Tizanidine is a lesser medication and that cost should not affect the insurance company paying for the medicine." I was asked if I knew the price of the medications and I advised them to call the pharmacy to ask. Shanon Brow also stated that, "A side effect of Tizanidine was that it causes back pain." I looked up Tizanidine and stated that I did not see that listed as a side effect. Shanon Brow then stated, "He was not arguing with me about this medication, he saw back pain as a side effect when he looked on [employer]."     I offered to complete a Prior Authorization for Robaxin and informed that patient and Shanon Brow that a Prior Authorization would not guarantee coverage. They verbalized understanding. I also informed them that it could take 3-5 days to get a response back from the Prior Authorization.   I will file the Prior Authorization on Cover My Meds

## 2018-05-27 NOTE — Telephone Encounter (Signed)
Yes thanks-can send in methocarbamol

## 2018-05-28 ENCOUNTER — Other Ambulatory Visit: Payer: Self-pay

## 2018-05-28 MED ORDER — METHOCARBAMOL 500 MG PO TABS: 500.0000 mg | ORAL_TABLET | Freq: Three times a day (TID) | ORAL | 2 refills | Status: DC

## 2018-05-28 NOTE — Telephone Encounter (Signed)
Prescription sent to pharmacy. Called patient to make her aware but voicemail box not set up

## 2018-07-29 ENCOUNTER — Encounter: Payer: Self-pay | Admitting: Family Medicine

## 2018-07-29 ENCOUNTER — Ambulatory Visit (INDEPENDENT_AMBULATORY_CARE_PROVIDER_SITE_OTHER): Payer: Medicare Other | Admitting: Family Medicine

## 2018-07-29 ENCOUNTER — Other Ambulatory Visit: Payer: Self-pay

## 2018-07-29 VITALS — BP 138/90 | HR 89 | Temp 98.3°F | Ht 65.0 in | Wt 277.0 lb

## 2018-07-29 DIAGNOSIS — I10 Essential (primary) hypertension: Secondary | ICD-10-CM | POA: Diagnosis not present

## 2018-07-29 DIAGNOSIS — E785 Hyperlipidemia, unspecified: Secondary | ICD-10-CM

## 2018-07-29 DIAGNOSIS — E119 Type 2 diabetes mellitus without complications: Secondary | ICD-10-CM | POA: Diagnosis not present

## 2018-07-29 LAB — COMPREHENSIVE METABOLIC PANEL
ALT: 53 U/L — ABNORMAL HIGH (ref 0–35)
AST: 31 U/L (ref 0–37)
Albumin: 4.1 g/dL (ref 3.5–5.2)
Alkaline Phosphatase: 58 U/L (ref 39–117)
BUN: 16 mg/dL (ref 6–23)
CO2: 29 meq/L (ref 19–32)
CREATININE: 0.71 mg/dL (ref 0.40–1.20)
Calcium: 10.7 mg/dL — ABNORMAL HIGH (ref 8.4–10.5)
Chloride: 98 mEq/L (ref 96–112)
GFR: 82.54 mL/min (ref >60.00)
Glucose, Bld: 174 mg/dL — ABNORMAL HIGH (ref 70–99)
Potassium: 4.4 mEq/L (ref 3.5–5.1)
Sodium: 136 mEq/L (ref 135–145)
Total Bilirubin: 0.5 mg/dL (ref 0.2–1.2)
Total Protein: 6.6 g/dL (ref 6.0–8.3)

## 2018-07-29 LAB — CBC
HCT: 43.2 % (ref 36.0–46.0)
Hemoglobin: 14.4 g/dL (ref 12.0–15.0)
MCHC: 33.3 g/dL (ref 30.0–36.0)
MCV: 90.9 fl (ref 78.0–100.0)
Platelets: 275 10*3/uL (ref 150.0–400.0)
RBC: 4.76 Mil/uL (ref 3.87–5.11)
RDW: 13.4 % (ref 11.5–15.5)
WBC: 8.5 10*3/uL (ref 4.0–10.5)

## 2018-07-29 LAB — LDL CHOLESTEROL, DIRECT: Direct LDL: 100 mg/dL

## 2018-07-29 LAB — HEMOGLOBIN A1C: Hgb A1c MFr Bld: 7.9 % — ABNORMAL HIGH (ref 4.6–6.5)

## 2018-07-29 NOTE — Progress Notes (Signed)
Phone [phone]   Subjective:  Tammy Harris is a 68 y.o. year old very pleasant female patient who presents for/with See problem oriented charting ROS- No chest pain or shortness of breath. No worsening headache pattern- has had this since being on letrozole. no blurry vision.    Past Medical History-  Patient Active Problem List   Diagnosis Date Noted  . Breast cancer ([place]) 12/13/2014    Priority: High  . Morbid obesity ([place]) 08/15/2014    Priority: High  . Diabetes ([place]) 03/30/2012    Priority: High  . Frequent headaches     Priority: Medium  . Hypertension 03/30/2012    Priority: Medium  . Hyperlipidemia 03/30/2012    Priority: Medium  . Low back pain 12/26/2016    Priority: Low    Medications- reviewed and updated Current Outpatient Medications  Medication Sig Dispense Refill  . amLODipine (NORVASC) 5 MG tablet Take 1 tablet (5 mg total) by mouth daily. 90 tablet 3  . aspirin 81 MG tablet Take 81 mg by mouth daily.    . fluticasone (FLONASE) 50 MCG/ACT nasal spray USE 2 SPRAYS INTO EACH NOSTRIL EVERY DAY 48 g 2  . hydrochlorothiazide (HYDRODIURIL) 25 MG tablet Take 1 tablet (25 mg total) by mouth daily. 90 tablet 3  . letrozole (FEMARA) 2.5 MG tablet Take 2.5 mg by mouth daily.    Marland Kitchen losartan (COZAAR) 100 MG tablet Take 1 tablet (100 mg total) by mouth daily. 90 tablet 3  . metFORMIN (GLUCOPHAGE) 1000 MG tablet Take 1 tablet (1,000 mg total) by mouth 2 (two) times daily with a meal. 180 tablet 0  . methocarbamol (ROBAXIN) 500 MG tablet Take 1 tablet (500 mg total) by mouth 3 (three) times daily. 90 tablet 2  . pravastatin (PRAVACHOL) 40 MG tablet Take 1 tablet (40 mg total) by mouth daily. 90 tablet 3   No current facility-administered medications for this visit.      Objective:  BP 138/90 (BP Location: Left Arm, Patient Position: Sitting, Cuff Size: Large)   Pulse 89   Temp 98.3 F (36.8 C) (Oral)   Ht 5\' 5"  (1.651 m)   Wt 277 lb (125.6 kg)   SpO2 96%    BMI 46.10 kg/m  Gen: NAD, resting comfortably CV: RRR no murmurs rubs or gallops Lungs: CTAB no crackles, wheeze, rhonchi Abdomen: soft/nontender/nondistended/normal bowel sounds. No rebound or guarding.  Ext: trace edema Skin: warm, dry Neuro: normal gait and speech    Assessment and Plan   #hypertension S: mild poorly controlled on repeat on amlodipine 5 mg and losartan hydrochlorothiazide 100-25 mg BP Readings from Last 3 Encounters:  07/29/18 138/90  01/28/18 124/86  12/26/16 118/82  A/P:  Last few #s have been good- we are going to do some home monitoring. Plan on 6 month follow up unless running high at home.    #hyperlipidemia S: Reasonably controlled on pravastatin 40 mg with last LDL under 100 in September 2019  A/P:  Stable. Continue current medications.  Update LDL   # Diabetes S:  controlled on metformin 1 g twice daily.  Weight stable from last visit-has gained 4 pounds last visit. Working on getting 5 lbs off.  Lab Results  Component Value Date   HGBA1C 7.0 (A) 01/28/2018   HGBA1C 6.8 (H) 12/26/2016   HGBA1C 7.1 (H) 06/23/2016   A/P: Hopefully stable-update A1c.  Consider options such as Januvia or Amaryl.  She has declined nutrition counseling in the past  Return in about 6 months (around 01/29/2019) for physical.  Lab/Order associations: Type 2 diabetes mellitus without complication, without long-term current use of insulin (HCC) - Plan: CBC, Comprehensive metabolic panel, LDL cholesterol, direct, Hemoglobin A1c  Hyperlipidemia, unspecified hyperlipidemia type  Essential hypertension  Return precautions advised.  Garret Reddish, MD

## 2018-07-29 NOTE — Patient Instructions (Addendum)
Health Maintenance Due  Topic Date Due  . OPHTHALMOLOGY EXAM - get this completed when things settle  04/05/1961  . PAP SMEAR- get this done when things settle down 08/26/2014  . DEXA SCAN - we will update this in the chart as you had this at [facility] 04/04/2018   Blood pressure - Last few #s have been good- we are going to do some home monitoring. Plan on 6 month follow up unless running high at home.   Please stop by lab before you go If you do not have mychart- we will call you about results within 5 business days of [place] receiving them.  If you have mychart- we will send your results within 3 business days of [place] receiving them.  If abnormal or we want to clarify a result, we will call or mychart you to make sure you receive the message.  If you have questions or concerns or don't hear within 5-7 days, please send [place] a message or call us.

## 2018-08-04 MED ORDER — METFORMIN HCL 1000 MG PO TABS: 1000.0000 mg | ORAL_TABLET | Freq: Two times a day (BID) | ORAL | 0 refills | Status: DC

## 2018-08-04 MED ORDER — HYDROCHLOROTHIAZIDE 25 MG PO TABS: 25.0000 mg | ORAL_TABLET | Freq: Every day | ORAL | 3 refills | Status: DC

## 2018-08-04 MED ORDER — SITAGLIPTIN PHOSPHATE 100 MG PO TABS: 100.0000 mg | ORAL_TABLET | Freq: Every day | ORAL | 3 refills | Status: DC

## 2018-08-04 MED ORDER — AMLODIPINE BESYLATE 5 MG PO TABS: 5.0000 mg | ORAL_TABLET | Freq: Every day | ORAL | 3 refills | Status: DC

## 2018-08-04 MED ORDER — LOSARTAN POTASSIUM 100 MG PO TABS: 100.0000 mg | ORAL_TABLET | Freq: Every day | ORAL | 3 refills | Status: DC

## 2018-08-04 NOTE — Addendum Note (Signed)
Addended by: Gwenyth Ober R on: 08/04/2018 04:47 PM   Modules accepted: Orders

## 2018-08-04 NOTE — Addendum Note (Signed)
Addended by: Gwenyth Ober R on: 08/04/2018 04:33 PM   Modules accepted: Orders

## 2018-08-05 ENCOUNTER — Telehealth: Payer: Self-pay | Admitting: Family Medicine

## 2018-08-05 NOTE — Telephone Encounter (Signed)
See note  Copied from [place] [id number]. Topic: General - Call Back - No Documentation >> Aug 05, 2018  9:04 AM Reyne Dumas L wrote: Reason for CRM:   Pt called and left voicemail on [place] yesterday 08/04/2018 - simply stating she was returning a call and could be reached at [phone].

## 2018-08-11 ENCOUNTER — Other Ambulatory Visit: Payer: Self-pay | Admitting: Family Medicine

## 2018-08-11 DIAGNOSIS — E119 Type 2 diabetes mellitus without complications: Secondary | ICD-10-CM

## 2018-08-11 NOTE — Telephone Encounter (Signed)
Last OV 07/29/2018 Last refill 05/28/2018 #90/2 Next OV 02/01/2019  Forwarding to Dr. Yong Channel

## 2018-12-09 ENCOUNTER — Other Ambulatory Visit: Payer: Self-pay | Admitting: Family Medicine

## 2019-01-08 ENCOUNTER — Other Ambulatory Visit: Payer: Self-pay | Admitting: Family Medicine

## 2019-01-08 DIAGNOSIS — E119 Type 2 diabetes mellitus without complications: Secondary | ICD-10-CM

## 2019-01-31 ENCOUNTER — Other Ambulatory Visit: Payer: Self-pay | Admitting: Family Medicine

## 2019-01-31 DIAGNOSIS — E785 Hyperlipidemia, unspecified: Secondary | ICD-10-CM

## 2019-01-31 NOTE — Patient Instructions (Addendum)
Health Maintenance Due  Topic Date Due  . OPHTHALMOLOGY EXAM-please call to schedule your updated diabetic eye exam 04/05/1961  . PAP SMEAR- please call to schedule with your gynecologist and have them send me a copy 08/26/2014  . INFLUENZA VACCINE-high-dose flu shot today 12/11/2018   . Please check with your pharmacy to see if they have the shingrix vaccine. If they do- please get this immunization and update [place] by phone call or mychart with dates you receive the vaccine      Please stop by lab before you go Since  you have mychart- we will send your results within 3 business days of [place] receiving them.  If abnormal or we want to clarify a result, we will call or mychart you to make sure you receive the message.  If you have questions or concerns or don't hear within 5-7 days, please send [place] a message or call us.    Tammy Harris , Thank you for taking time to come for your Medicare Wellness Visit/welcome ot medicare. I appreciate your ongoing commitment to your health goals. Please review the following plan we discussed and let me know if I can assist you in the future.   These are the goals we discussed: 1.  Complete health maintenance items as above 2.  Continue excellent job with healthy eating and regular exercise 3.  for diabetes we opted to continue metformin once a day and then [place] once a day until runs out. Continue weight loss journey. Once runs out of [place] resume metformin 1000mg  twice a day until follow up- possible we can go to 500 twice a day as continues her journey. If she has any lows- she will let me know (sugar under 80)  4.  lets check home blood pressures daily for 2 weeks and then update me by mychart. Try to continue efforts for exercise, low salt diet. We may have to tweak medicines if remains high. May increase amlodipine to 10mg - would have to watch swelling and stick with compression stockings.  5. recommended she and husband consider living will and HCPOA  (see handout)   This is a list of the screening recommended for you and due dates:  Health Maintenance  Topic Date Due  . Eye exam for diabetics  04/05/1961  . Pap Smear  08/26/2014  . Flu Shot  12/11/2018  . Complete foot exam   01/29/2019  . Hemoglobin A1C  01/29/2019  . HIV Screening  12/24/2093*  . Mammogram  02/07/2019  . Cologuard (Stool DNA test)  02/02/2021  . Pneumonia vaccines (2 of 2 - PPSV23) 06/23/2021  . Tetanus Vaccine  08/25/2021  . DEXA scan (bone density measurement)  Completed  .  Hepatitis C: One time screening is recommended by Center for Disease Control  (CDC) for  adults born from [birth year] through 1963.   Completed  *Topic was postponed. The date shown is not the original due date.

## 2019-01-31 NOTE — Progress Notes (Signed)
Phone: [phone]   Subjective:  Patient presents today for their Welcome to Medicare Exam    Preventive Screening-Counseling & Management  Vision screen: pt has not had an diabetic eye exam and has not scheduled one due to her husbands health  Hearing Screening   125Hz  250Hz  500Hz  1000Hz  2000Hz  3000Hz  4000Hz  6000Hz  8000Hz   Right ear:           Left ear:             Visual Acuity Screening   Right eye Left eye Both eyes  Without correction:     With correction: 20/30 20/25 20/20    Advanced directives: No, recommended she and husband consider living will and HCPOA  Smoking Status: No Second Hand Smoking status: No Alcohol Intake: No  Risk Factors Regular exercise: yes, walking or bicycling in the pool twice a week.  Congratulated patient on effort  Diet: very well, salads- patient has lost 14 pounds since last visit!  Congratulated efforts and encouraged to continue. Using intermittent fasting all Risk: done (no falls) Fall Risk  02/01/2019 01/28/2018 06/23/2016 08/02/2015 04/18/2014  Falls in the past year? 0 No No No No  Opioid use history:  no long term opioids use  BMI monitoring- elevated BMI noted: Body mass index is 43.77 kg/m. Encouraged need for healthy eating, regular exercise, weight loss.   BMI Metric Follow Up - 02/01/19 1102      BMI Metric Follow Up-Please document annually   BMI Metric Follow Up  Education provided       Cardiac risk factors:  advanced age (older than 66 for men, 4 for women)-yes Hyperlipidemia -yes on medication Diabetes-yes, well controlled Hypertension- yes on medication- hoping to see home #s swing down Family History: No family history of coronary artery disease EKG updated for welcome to medicare  Depression Screen None. PHQ2 0  Depression screen [facility] 2/9 02/01/2019 01/28/2018 06/23/2016 03/04/2016 08/02/2015  Decreased Interest 0 0 0 0 0  Down, Depressed, Hopeless 0 0 - 0 0  PHQ - 2 Score 0 0 0 0 0    Activities of Daily Living  Independent ADLs and IADLs   Hearing Difficulties: -patient declines  Cognitive Testing             No reported trouble.   Mini cog: normal clock draw. 3/3 delayed recall. Normal test result   [facility] baby  List the Names of Other Physician/Practitioners you currently use: -[facility] hematology/oncology-last saw Magda Kiel, [place] -Ophthalmology- she cannot recall name today  Immunization History  Administered Date(s) Administered  . Influenza Split 03/30/2012  . Influenza, Quadrivalent, Recombinant, Inj, Pf 03/26/2017  . Influenza,inj,Quad PF,6+ Mos 01/11/2013, 12/20/2013, 03/04/2016, 01/28/2018  . Influenza-Unspecified 01/11/2015  . Pneumococcal Conjugate-13 08/15/2014  . Pneumococcal Polysaccharide-23 06/23/2016  . Tdap 08/26/2011   Required Immunizations needed today -high-dose flu shot given.   Screening tests- up to date 1. Colon cancer screening- Cologuard 02/02/2018 with 3-year follow-up 2. Lung Cancer screening- not a candidate as never smoker 3. Skin cancer screening- no dermatologist. Denies worrisome skin lesions. Avoids sun exposure for most part- encouraged to consider vitamin D- she is taing 4. Cervical cancer screening- patient is due for Pap smear per our records- would be last required Pap smear. She will call to schedule 5. Breast cancer history- close follow-up with heme[facility]onc at [facility]- last mammogram we have on file is September 2018 but she received these at [facility]- No pertinent positives discovered in course of  AWV ROS pertinent- No chest pain or shortness of breath. No increased edema or blurry vision.   The following were reviewed and entered/updated in epic: Past Medical History:  Diagnosis Date  . Arthritis   . Cancer ([place]) 2015   BREAST  . Diabetes mellitus without complication ([place])   . Frequent headaches    long term issues. saw neurology in the past. rarely takes rx- excedrin if bad  . Hyperlipidemia   .  Hypertension    Patient Active Problem List   Diagnosis Date Noted  . Breast cancer ([place]) 12/13/2014    Priority: High  . Morbid obesity ([place]) 08/15/2014    Priority: High  . Diabetes ([place]) 03/30/2012    Priority: High  . Frequent headaches     Priority: Medium  . Hypertension 03/30/2012    Priority: Medium  . Hyperlipidemia 03/30/2012    Priority: Medium  . Low back pain 12/26/2016    Priority: Low   Past Surgical History:  Procedure Laterality Date  . BREAST SURGERY     lumpectomy bilateral, biopsy precceeded  . CESAREAN SECTION     x2  . ELBOW BURSA SURGERY Right   . FOOT SURGERY     bone spurs   . hernia groin left     as child  . NASAL POLYP EXCISION    . TOTAL KNEE ARTHROPLASTY Bilateral 2008    Family History  Problem Relation Age of Onset  . Hyperlipidemia Mother        88 in 2018  . Atrial fibrillation Mother   . Hypertension Father   . Parkinson's disease Father   . Healthy Sister   . Hypertension Brother        overweight  . Prostate cancer Brother   . Breast cancer Maternal Grandmother        mets- led to death. great grandmother had stroke young.   Marland Kitchen Heart disease Paternal Grandmother        pacemaker  . Diabetes Maternal Uncle   . Hypothyroidism Sister        after removal    Medications- reviewed and updated Current Outpatient Medications  Medication Sig Dispense Refill  . amLODipine (NORVASC) 5 MG tablet Take 1 tablet (5 mg total) by mouth daily. 90 tablet 3  . aspirin 81 MG tablet Take 81 mg by mouth daily.    . fluticasone (FLONASE) 50 MCG/ACT nasal spray USE 2 SPRAYS INTO EACH NOSTRIL EVERY DAY 48 g 2  . hydrochlorothiazide (HYDRODIURIL) 25 MG tablet Take 1 tablet (25 mg total) by mouth daily. 90 tablet 3  . letrozole (FEMARA) 2.5 MG tablet Take 2.5 mg by mouth daily.    Marland Kitchen losartan (COZAAR) 100 MG tablet Take 1 tablet (100 mg total) by mouth daily. 90 tablet 3  . metFORMIN (GLUCOPHAGE) 1000 MG tablet TAKE 1 TABLET BY MOUTH  TWICE DAILY  WITH MEALS 180 tablet 0  . methocarbamol (ROBAXIN) 500 MG tablet TAKE 1 TABLET BY MOUTH 3  TIMES DAILY 90 tablet 1  . pravastatin (PRAVACHOL) 40 MG tablet TAKE 1 TABLET BY MOUTH  DAILY 90 tablet 1  . sitaGLIPtin (JANUVIA) 100 MG tablet Take 1 tablet (100 mg total) by mouth daily. 90 tablet 3   No current facility-administered medications for this visit.     Allergies-reviewed and updated Allergies  Allergen Reactions  . Ace Inhibitors     Patient developed a cough with use of ACE inhibitors.  . Sulfa Antibiotics     Social  History   Socioeconomic History  . Marital status: Married    Spouse name: Not on file  . Number of children: Not on file  . Years of education: Not on file  . Highest education level: Not on file  Occupational History  . Not on file  Social Needs  . Financial resource strain: Not on file  . Food insecurity    Worry: Not on file    Inability: Not on file  . Transportation needs    Medical: Not on file    Non-medical: Not on file  Tobacco Use  . Smoking status: Never Smoker  . Smokeless tobacco: Never Used  Substance and Sexual Activity  . Alcohol use: No  . Drug use: No  . Sexual activity: Yes  Lifestyle  . Physical activity    Days per week: Not on file    Minutes per session: Not on file  . Stress: Not on file  Relationships  . Social Herbalist on phone: Not on file    Gets together: Not on file    Attends religious service: Not on file    Active member of club or organization: Not on file    Attends meetings of clubs or organizations: Not on file    Relationship status: Not on file  Other Topics Concern  . Not on file  Social History Narrative   Married 1975. 2 sons. 4 grandkids- all local (21 to 10 in 2018)      Pianist in church.    2 years of college.       Hobbies: enjoys watching football (since radiation)- enjoys NFL, music, movies, pool in back yard, grandkids   Objective  Objective:  BP (!) 152/90   Pulse 83    Ht 5\' 5"  (1.651 m)   Wt 263 lb (119.3 kg)   SpO2 98%   BMI 43.77 kg/m  Gen: NAD, resting comfortably HEENT: Mucous membranes are moist. Oropharynx normal Neck: no thyromegaly CV: RRR no murmurs rubs or gallops Lungs: CTAB no crackles, wheeze, rhonchi Abdomen: soft/nontender/nondistended/normal bowel sounds. No rebound or guarding Ext: trace edema Skin: warm, dry Neuro: grossly normal, moves all extremities, PERRLA    Diabetic Foot Exam - Simple   Simple Foot Form Diabetic Foot exam was performed with the following findings: Yes 02/01/2019 10:54 AM  Visual Inspection No deformities, no ulcerations, no other skin breakdown bilaterally: Yes Sensation Testing Intact to touch and monofilament testing bilaterally: Yes Pulse Check Posterior Tibialis and Dorsalis pulse intact bilaterally: Yes Comments     EKG: sinus rhythm with rate 76, left axis, normal intervals, no hypertrophy, no st or t wave chages, low voltage noted    Assessment and Plan:   Welcome to Medicare exam completed- discussed recommended screenings and documented any personalized health advice and referrals for preventive counseling. See AVS as well which was given to patient.   Status of chronic or acute concerns  Morbid obesity-counseling was provided as above  Breast cancer synchronous bilateral-treated with lumpectomy and radiation--continues follow-up with oncology at [facility] on letrozole. 02/08/18 had mammogram- will have team abstract. Continue current therapy.   Diabetes- on metformin 1 g twice a day-excellent control today on point of care machine.  Also on januvia since 08/04/2018. Just bought 3 months and very expensive at $300 Lab Results  Component Value Date   HGBA1C 5.8 (A) 02/01/2019   HGBA1C 7.9 (H) 07/29/2018   HGBA1C 7.0 (A) 01/28/2018  - for  diabetes we opted to continue metformin once a day and then [place] once a day until runs out. Continue weight loss journey. Once runs out of  [place] resume metformin 1000mg  twice a day until follow up- possible we can go to 500 twice a day as continues her journey. If she has any lows- she will let me know (sugar under 80)   Hypertension-poor control on initial blood pressure check.  Blood pressure slightly high at last visit and plan was for home monitoring and sooner follow-up in 6 months if elevated at home- she reports at home - has not had chance to check recently.  On repeat remained elevated.  She continues on amlodipine 5 mg and losartan hydrochlorothiazide 100-25 mg. - lets check home blood pressures daily for 2 weeks and then update me by mychart. Try to continue efforts for exercise, low salt diet. We may have to tweak medicines if remains high. May increase amlodipine to 10mg - would have to watch swelling and stick with compression stockings.   Hyperlipidemia- compliant with pravastatin 40 mg.  Update lipid panel today  Low back pain since 2008 at least-no fall or injury.  Prior MRI of lumbar spine show degenerative disc disease and spurs.  Uses methocarbamol if needed. Recent travel seemed to flare it up- doing better now   Recommended follow up: 6 month follow up   Lab/Order associations:   ICD-10-CM   1. Preventative health care  Z00.00 POCT glycosylated hemoglobin (Hb A1C)    EKG 12-Lead    CBC    Comprehensive metabolic panel    Lipid panel  2. Essential hypertension  I10 EKG 12-Lead  3. Type 2 diabetes mellitus without complication, without long-term current use of insulin (HCC)  E11.9 POCT glycosylated hemoglobin (Hb A1C)    CBC    Comprehensive metabolic panel    Lipid panel  4. Hyperlipidemia, unspecified hyperlipidemia type  E78.5 CBC    Comprehensive metabolic panel    Lipid panel  5. Morbid obesity ([place])  E66.01   6. Bilateral malignant neoplasm of breast in female, unspecified estrogen receptor status, unspecified site of breast ([place])  C50.911    C50.912    Return precautions advised. Garret Reddish, MD

## 2019-02-01 ENCOUNTER — Encounter: Payer: Self-pay | Admitting: Family Medicine

## 2019-02-01 ENCOUNTER — Ambulatory Visit (INDEPENDENT_AMBULATORY_CARE_PROVIDER_SITE_OTHER): Payer: Medicare Other | Admitting: Family Medicine

## 2019-02-01 ENCOUNTER — Other Ambulatory Visit: Payer: Self-pay

## 2019-02-01 VITALS — BP 152/90 | HR 83 | Ht 65.0 in | Wt 263.0 lb

## 2019-02-01 DIAGNOSIS — I10 Essential (primary) hypertension: Secondary | ICD-10-CM

## 2019-02-01 DIAGNOSIS — C50912 Malignant neoplasm of unspecified site of left female breast: Secondary | ICD-10-CM

## 2019-02-01 DIAGNOSIS — Z0000 Encounter for general adult medical examination without abnormal findings: Secondary | ICD-10-CM

## 2019-02-01 DIAGNOSIS — E785 Hyperlipidemia, unspecified: Secondary | ICD-10-CM | POA: Diagnosis not present

## 2019-02-01 DIAGNOSIS — E119 Type 2 diabetes mellitus without complications: Secondary | ICD-10-CM | POA: Diagnosis not present

## 2019-02-01 DIAGNOSIS — C50911 Malignant neoplasm of unspecified site of right female breast: Secondary | ICD-10-CM

## 2019-02-01 LAB — COMPREHENSIVE METABOLIC PANEL
ALT: 32 U/L (ref 0–35)
AST: 24 U/L (ref 0–37)
Albumin: 4.2 g/dL (ref 3.5–5.2)
Alkaline Phosphatase: 57 U/L (ref 39–117)
BUN: 7 mg/dL (ref 6–23)
CO2: 30 mEq/L (ref 19–32)
Calcium: 10.4 mg/dL (ref 8.4–10.5)
Chloride: 92 mEq/L — ABNORMAL LOW (ref 96–112)
Creatinine, Ser: 0.61 mg/dL (ref 0.40–1.20)
GFR: 98.18 mL/min (ref >60.00)
Glucose, Bld: 87 mg/dL (ref 70–99)
Potassium: 4.3 mEq/L (ref 3.5–5.1)
Sodium: 131 mEq/L — ABNORMAL LOW (ref 135–145)
Total Bilirubin: 0.5 mg/dL (ref 0.2–1.2)
Total Protein: 6.6 g/dL (ref 6.0–8.3)

## 2019-02-01 LAB — LIPID PANEL
Cholesterol: 151 mg/dL (ref 0–200)
HDL: 49 mg/dL (ref >39.00)
LDL Cholesterol: 76 mg/dL (ref 0–99)
NonHDL: 102.39
Total CHOL/HDL Ratio: 3
Triglycerides: 132 mg/dL (ref 0.0–149.0)
VLDL: 26.4 mg/dL (ref 0.0–40.0)

## 2019-02-01 LAB — CBC
HCT: 41.1 % (ref 36.0–46.0)
Hemoglobin: 13.7 g/dL (ref 12.0–15.0)
MCHC: 33.2 g/dL (ref 30.0–36.0)
MCV: 88.7 fl (ref 78.0–100.0)
Platelets: 348 10*3/uL (ref 150.0–400.0)
RBC: 4.63 Mil/uL (ref 3.87–5.11)
RDW: 13.3 % (ref 11.5–15.5)
WBC: 8.8 10*3/uL (ref 4.0–10.5)

## 2019-02-01 LAB — POCT GLYCOSYLATED HEMOGLOBIN (HGB A1C): Hemoglobin A1C: 5.8 % — AB (ref 4.0–5.6)

## 2019-02-09 ENCOUNTER — Other Ambulatory Visit: Payer: Self-pay | Admitting: Family Medicine

## 2019-02-09 ENCOUNTER — Encounter: Payer: Self-pay | Admitting: Family Medicine

## 2019-02-09 DIAGNOSIS — E119 Type 2 diabetes mellitus without complications: Secondary | ICD-10-CM

## 2019-02-09 MED ORDER — METHOCARBAMOL 500 MG PO TABS: 500.0000 mg | ORAL_TABLET | Freq: Three times a day (TID) | ORAL | 1 refills | Status: DC

## 2019-02-09 NOTE — Telephone Encounter (Signed)
Requested medication (s) are due for refill today: yes  Requested medication (s) are on the active medication list: yes  Last refill: 12/09/2018  Future visit scheduled: yes  Notes to clinic: refill cannot be delegated   Requested Prescriptions  Pending Prescriptions Disp Refills   methocarbamol (ROBAXIN) 500 MG tablet 90 tablet 1    Sig: Take 1 tablet (500 mg total) by mouth 3 (three) times daily.     Not Delegated - Analgesics:  Muscle Relaxants Failed - 02/09/2019  2:47 PM      Failed - This refill cannot be delegated      Passed - Valid encounter within last 6 months    Recent Outpatient Visits          1 week ago Preventative health care   [place], Brayton Mars, MD   6 months ago Type 2 diabetes mellitus without complication, without long-term current use of insulin [facility])   [facility] [place], Brayton Mars, MD   1 year ago Essential hypertension   [place], Brayton Mars, MD   2 years ago Essential hypertension   Therapist, music at [place], MD   2 years ago Left-sided low back pain without sciatica, unspecified chronicity   [facility] [place], Ranell Patrick, MD      Future Appointments            In 5 months Hunter, Brayton Mars, MD [place], [facility]

## 2019-02-09 NOTE — Telephone Encounter (Signed)
See note

## 2019-02-09 NOTE — Telephone Encounter (Signed)
Medication Refill - Medication: methocarbamol (ROBAXIN) 500 MG tablet   Has the patient contacted their pharmacy? Yes.   (Agent: If no, request that the patient contact the pharmacy for the refill.) (Agent: If yes, when and what did the pharmacy advise?)  Preferred Pharmacy (with phone number or street name):  [place], [place] [phone] (Phone) [phone] (Fax)     Agent: Please be advised that RX refills may take up to 3 business days. We ask that you follow-up with your pharmacy.

## 2019-02-14 DIAGNOSIS — C50911 Malignant neoplasm of unspecified site of right female breast: Secondary | ICD-10-CM | POA: Diagnosis not present

## 2019-02-14 DIAGNOSIS — C50912 Malignant neoplasm of unspecified site of left female breast: Secondary | ICD-10-CM | POA: Diagnosis not present

## 2019-02-14 DIAGNOSIS — C50512 Malignant neoplasm of lower-outer quadrant of left female breast: Secondary | ICD-10-CM | POA: Diagnosis not present

## 2019-02-14 DIAGNOSIS — Z170 Estrogen receptor positive status [ER+]: Secondary | ICD-10-CM | POA: Diagnosis not present

## 2019-02-14 DIAGNOSIS — C50511 Malignant neoplasm of lower-outer quadrant of right female breast: Secondary | ICD-10-CM | POA: Diagnosis not present

## 2019-04-14 ENCOUNTER — Other Ambulatory Visit: Payer: Self-pay | Admitting: Family Medicine

## 2019-06-07 ENCOUNTER — Other Ambulatory Visit: Payer: Self-pay

## 2019-06-07 ENCOUNTER — Telehealth: Payer: Self-pay | Admitting: Family Medicine

## 2019-06-07 MED ORDER — METHOCARBAMOL 500 MG PO TABS: 500.0000 mg | ORAL_TABLET | Freq: Three times a day (TID) | ORAL | 1 refills | Status: DC

## 2019-06-07 NOTE — Telephone Encounter (Signed)
  LAST APPOINTMENT DATE: 04/14/2019   NEXT APPOINTMENT DATE:08/02/2019  MEDICATION:methocarbamol (ROBAXIN) 500 MG tablet  PHARMACY: [facility]  [place], [place], [place] [id number] [id number]

## 2019-06-07 NOTE — Telephone Encounter (Signed)
Rx sent in to pharmacy. 

## 2019-07-02 ENCOUNTER — Ambulatory Visit: Payer: Medicare Other | Attending: Internal Medicine

## 2019-07-02 DIAGNOSIS — Z23 Encounter for immunization: Secondary | ICD-10-CM

## 2019-07-02 NOTE — Progress Notes (Signed)
   Covid-19 Vaccination Clinic  Name:  Tammy Harris    MRN: [id number] DOB: Jul 20, 1950  07/02/2019  [name] was observed post Covid-19 immunization for 15 minutes without incidence. She was provided with Vaccine Information Sheet and instruction to access the V-Safe system.   [name] was instructed to call 911 with any severe reactions post vaccine: Marland Kitchen Difficulty breathing  . Swelling of your face and throat  . A fast heartbeat  . A bad rash all over your body  . Dizziness and weakness    Immunizations Administered    Name Date Dose VIS Date Route   Pfizer COVID-19 Vaccine 07/02/2019 12:05 PM 0.3 mL 04/22/2019 Intramuscular   Manufacturer: [place]   Lot: [id number]   [place]: [id number]

## 2019-07-17 ENCOUNTER — Other Ambulatory Visit: Payer: Self-pay | Admitting: Family Medicine

## 2019-07-21 ENCOUNTER — Other Ambulatory Visit: Payer: Self-pay | Admitting: Family Medicine

## 2019-07-21 DIAGNOSIS — I10 Essential (primary) hypertension: Secondary | ICD-10-CM

## 2019-07-26 ENCOUNTER — Ambulatory Visit: Payer: Medicare Other | Attending: Internal Medicine

## 2019-07-26 DIAGNOSIS — Z23 Encounter for immunization: Secondary | ICD-10-CM

## 2019-07-26 NOTE — Progress Notes (Signed)
   Covid-19 Vaccination Clinic  Name:  Tammy Harris    MRN: [id number] DOB: 1950/08/23  07/26/2019  Tammy Harris was observed post Covid-19 immunization for 15 minutes without incident. She was provided with Vaccine Information Sheet and instruction to access the V-Safe system.   Tammy Harris was instructed to call 911 with any severe reactions post vaccine: Marland Kitchen Difficulty breathing  . Swelling of face and throat  . A fast heartbeat  . A bad rash all over body  . Dizziness and weakness   Immunizations Administered    Name Date Dose VIS Date Route   Pfizer COVID-19 Vaccine 07/26/2019 12:01 PM 0.3 mL 04/22/2019 Intramuscular   Manufacturer: [place]   Lot: [id number]   [place]: [id number]

## 2019-07-28 ENCOUNTER — Other Ambulatory Visit: Payer: Self-pay | Admitting: Family Medicine

## 2019-07-28 DIAGNOSIS — E785 Hyperlipidemia, unspecified: Secondary | ICD-10-CM

## 2019-08-02 ENCOUNTER — Other Ambulatory Visit: Payer: Self-pay

## 2019-08-02 ENCOUNTER — Encounter: Payer: Self-pay | Admitting: Family Medicine

## 2019-08-02 ENCOUNTER — Ambulatory Visit (INDEPENDENT_AMBULATORY_CARE_PROVIDER_SITE_OTHER): Payer: Medicare Other | Admitting: Family Medicine

## 2019-08-02 VITALS — BP 132/84 | HR 64 | Temp 98.0°F | Ht 65.0 in | Wt 235.0 lb

## 2019-08-02 DIAGNOSIS — C50911 Malignant neoplasm of unspecified site of right female breast: Secondary | ICD-10-CM

## 2019-08-02 DIAGNOSIS — E119 Type 2 diabetes mellitus without complications: Secondary | ICD-10-CM

## 2019-08-02 DIAGNOSIS — E1159 Type 2 diabetes mellitus with other circulatory complications: Secondary | ICD-10-CM | POA: Diagnosis not present

## 2019-08-02 DIAGNOSIS — E1169 Type 2 diabetes mellitus with other specified complication: Secondary | ICD-10-CM

## 2019-08-02 DIAGNOSIS — I152 Hypertension secondary to endocrine disorders: Secondary | ICD-10-CM

## 2019-08-02 DIAGNOSIS — E785 Hyperlipidemia, unspecified: Secondary | ICD-10-CM

## 2019-08-02 DIAGNOSIS — C50912 Malignant neoplasm of unspecified site of left female breast: Secondary | ICD-10-CM

## 2019-08-02 DIAGNOSIS — I10 Essential (primary) hypertension: Secondary | ICD-10-CM | POA: Diagnosis not present

## 2019-08-02 LAB — COMPREHENSIVE METABOLIC PANEL
ALT: 19 U/L (ref 0–35)
AST: 15 U/L (ref 0–37)
Albumin: 4.2 g/dL (ref 3.5–5.2)
Alkaline Phosphatase: 74 U/L (ref 39–117)
BUN: 8 mg/dL (ref 6–23)
CO2: 31 mEq/L (ref 19–32)
Calcium: 10.3 mg/dL (ref 8.4–10.5)
Chloride: 95 mEq/L — ABNORMAL LOW (ref 96–112)
Creatinine, Ser: 0.64 mg/dL (ref 0.40–1.20)
GFR: 92.75 mL/min (ref >60.00)
Glucose, Bld: 103 mg/dL — ABNORMAL HIGH (ref 70–99)
Potassium: 4.4 mEq/L (ref 3.5–5.1)
Sodium: 132 mEq/L — ABNORMAL LOW (ref 135–145)
Total Bilirubin: 0.5 mg/dL (ref 0.2–1.2)
Total Protein: 6.6 g/dL (ref 6.0–8.3)

## 2019-08-02 LAB — POCT GLYCOSYLATED HEMOGLOBIN (HGB A1C): Hemoglobin A1C: 5.4 % (ref 4.0–5.6)

## 2019-08-02 NOTE — Patient Instructions (Addendum)
Health Maintenance Due  Topic Date Due  . OPHTHALMOLOGY EXAM has not had . Try to get this updated and have them send [place] a copy Never done   significant improvement in weight as well as a1c- we opted to reduce the metformin to once a day- going to leave script as twice a day for now- will change if a1c remains 6.5 or below next visit.   Please stop by lab before you go If you do not have mychart- we will call you about results within 5 business days of [place] receiving them.  If you have mychart- we will send your results within 3 business days of [place] receiving them.  If abnormal or we want to clarify a result, we will call or mychart you to make sure you receive the message.  If you have questions or concerns or don't hear within 5 business days, please send [place] a message or call us.   Recommended follow up: Return in about 6 months (around 02/02/2020) for physical or sooner if needed.

## 2019-08-02 NOTE — Assessment & Plan Note (Signed)
S: Compliant with Metformin 1000 mg twice a day Exercise and diet-  still cuts off eating at 7 PM and finds that really helpful. Mainly doing 2 meals a day- a good breakfast and a heavier meal around 3-4 PM.  Trying to walk  Lab Results  Component Value Date   HGBA1C point-of-care 5.4 08/02/2019   HGBA1C point-of-care 5.8 (A) 02/01/2019   HGBA1C 7.9 (H) 07/29/2018   A/P: significant improvement in weight as well as a1c- we opted to reduce the metformin to once a day- going to leave script as twice a day for now- will change if a1c remains 6.5 or below next visit.

## 2019-08-02 NOTE — Assessment & Plan Note (Signed)
S: compliant with hydrochlorothiazide 25 mg, losartan 100 mg, amlodipine 5 mg A/P: Stable. Continue current medications. Asked her to monitor blood pressure as continues to lose weight and for any symptoms like lightheadedness as at times we have to reduce medicine with weight loss

## 2019-08-02 NOTE — Progress Notes (Signed)
Phone [phone] In person visit   Subjective:   Tammy Harris is a 69 y.o. year old very pleasant female patient who presents for/with See problem oriented charting Chief Complaint  Patient presents with  . Hypertension  . Diabetes   This visit occurred during the SARS-CoV-2 public health emergency.  Safety protocols were in place, including screening questions prior to the visit, additional usage of staff PPE, and extensive cleaning of exam room while observing appropriate contact time as indicated for disinfecting solutions.   Past Medical History-  Patient Active Problem List   Diagnosis Date Noted  . Breast cancer ([place]) 12/13/2014    Priority: High  . Morbid obesity ([place]) 08/15/2014    Priority: High  . Diabetes ([place]) 03/30/2012    Priority: High  . Frequent headaches     Priority: Medium  . Hypertension associated with diabetes ([place]) 03/30/2012    Priority: Medium  . Hyperlipidemia associated with type 2 diabetes mellitus ([place]) 03/30/2012    Priority: Medium  . Low back pain 12/26/2016    Priority: Low    Medications- reviewed and updated Current Outpatient Medications  Medication Sig Dispense Refill  . amLODipine (NORVASC) 5 MG tablet TAKE 1 TABLET BY MOUTH  DAILY 90 tablet 3  . aspirin 81 MG tablet Take 81 mg by mouth daily.    . fluticasone (FLONASE) 50 MCG/ACT nasal spray USE 2 SPRAYS INTO EACH NOSTRIL EVERY DAY 48 g 2  . hydrochlorothiazide (HYDRODIURIL) 25 MG tablet TAKE 1 TABLET BY MOUTH  DAILY 90 tablet 3  . letrozole (FEMARA) 2.5 MG tablet Take 2.5 mg by mouth daily.    Marland Kitchen losartan (COZAAR) 100 MG tablet TAKE 1 TABLET BY MOUTH  DAILY 90 tablet 3  . metFORMIN (GLUCOPHAGE) 1000 MG tablet TAKE 1 TABLET BY MOUTH  TWICE DAILY WITH MEALS 180 tablet 3  . methocarbamol (ROBAXIN) 500 MG tablet Take 1 tablet (500 mg total) by mouth 3 (three) times daily. 90 tablet 1  . pravastatin (PRAVACHOL) 40 MG tablet TAKE 1 TABLET BY MOUTH  DAILY 90 tablet 3  .  melatonin 5 MG TABS Take 1 tablet by mouth at bedtime.    Marland Kitchen VITAMIN D PO Take 1 tablet by mouth daily.     No current facility-administered medications for this visit.     Objective:  BP 132/84   Pulse 64   Temp 98 F (36.7 C) (Temporal)   Ht 5\' 5"  (1.651 m)   Wt 235 lb (106.6 kg)   BMI 39.11 kg/m  Gen: NAD, resting comfortably CV: RRR no murmurs rubs or gallops Lungs: CTAB no crackles, wheeze, rhonchi Abdomen: soft/nontender/nondistended/normal bowel sounds. No rebound or guarding.  [id number] edema Skin: warm, dry     Assessment and Plan   # Diabetes/morbid obesity S: Compliant with Metformin 1000 mg twice a day Exercise and diet-  still cuts off eating at 7 PM until 10 AM next AM and finds that really helpful. Mainly doing 2 meals a day- a good breakfast and a heavier meal around 3-4 PM.  Trying to walk  Lab Results  Component Value Date   HGBA1C point-of-care 5.4 08/02/2019   HGBA1C point-of-care 5.8 (A) 02/01/2019   HGBA1C 7.9 (H) 07/29/2018   A/P: significant improvement in weight as well as a1c- we opted to reduce the metformin to once a day- going to leave script as twice a day for now- will change if a1c remains 6.5 or below next visit.   Great job on  weight loss! Encouraged to keep up the good work   #hypertension S: compliant with hydrochlorothiazide 25 mg, losartan 100 mg A/P: Stable. Continue current medications. Asked her to monitor blood pressure as continues to lose weight and for any symptoms like lightheadedness as at times we have to reduce medicine with weight loss   #hyperlipidemia S: compliant with pravastatin 40 mg Lab Results  Component Value Date   CHOL 151 02/01/2019   HDL 49.00 02/01/2019   LDLCALC 76 02/01/2019   LDLDIRECT 100.0 07/29/2018   TRIG 132.0 02/01/2019   CHOLHDL 3 02/01/2019   A/P: hopefully improving- we will check lipid panel in 6 months  #Low back pain S: Patient with history of degenerative disc disease based on MRI of  lumbar spine in the past.  Intermittent flares at times and uses methocarbamol- still-  A/P: patient hoping weight loss will help both back and knees- continue her excellent weight loss journey down 28 lbs from last visit   #mild hyponatremia- drinks a lot of water now to help with weight loss and that may contribute. Also eating healthier/less salt so that may contribute- monitor only as long as above 130.   #breast cancer- still on femara for breast cancer- thinks she will be done in December. Continue current meds and oncology follow up . Started December 2016  Recommended follow up: Return in about 6 months (around 02/02/2020) for physical or sooner if needed.  Lab/Order associations:   ICD-10-CM   1. Type 2 diabetes mellitus without complication, without long-term current use of insulin (HCC)  E11.9 POCT glycosylated hemoglobin (Hb A1C)  2. Hypertension associated with diabetes ([place])  E11.59 Comprehensive metabolic panel   99991111     Return precautions advised.  Garret Reddish, MD

## 2019-08-04 ENCOUNTER — Telehealth: Payer: Self-pay | Admitting: Family Medicine

## 2019-08-04 NOTE — Telephone Encounter (Signed)
MEDICATION: robaxin 500 MG  PHARMACY: [facility] [address], [place] [address] Phone # [phone]   Comments: Pt requesting 90 day supply  **Let patient know to contact pharmacy at the end of the day to make sure medication is ready. **  ** Please notify patient to allow 48-72 hours to process**  **Encourage patient to contact the pharmacy for refills or they can request refills through [facility]**

## 2019-08-05 ENCOUNTER — Other Ambulatory Visit: Payer: Self-pay

## 2019-08-05 MED ORDER — METHOCARBAMOL 500 MG PO TABS: 500.0000 mg | ORAL_TABLET | Freq: Three times a day (TID) | ORAL | 1 refills | Status: DC

## 2019-08-10 ENCOUNTER — Telehealth: Payer: Self-pay | Admitting: Family Medicine

## 2019-08-10 MED ORDER — METHOCARBAMOL 500 MG PO TABS: 500.0000 mg | ORAL_TABLET | Freq: Three times a day (TID) | ORAL | 1 refills | Status: DC

## 2019-08-10 NOTE — Telephone Encounter (Signed)
MEDICATION: methocarbamol 500 MG tablet  PHARMACY: [facility]   Comments:   **Let patient know to contact pharmacy at the end of the day to make sure medication is ready. **  ** Please notify patient to allow 48-72 hours to process**  **Encourage patient to contact the pharmacy for refills or they can request refills through [facility]**

## 2019-08-10 NOTE — Telephone Encounter (Signed)
Medication sent in. 

## 2019-09-17 ENCOUNTER — Other Ambulatory Visit: Payer: Self-pay | Admitting: Family Medicine

## 2019-11-07 ENCOUNTER — Other Ambulatory Visit: Payer: Self-pay | Admitting: Family Medicine

## 2019-12-07 ENCOUNTER — Other Ambulatory Visit: Payer: Self-pay | Admitting: Family Medicine

## 2020-01-02 ENCOUNTER — Other Ambulatory Visit: Payer: Self-pay | Admitting: Family Medicine

## 2020-01-03 NOTE — Telephone Encounter (Signed)
LAST APPOINTMENT DATE: 08/02/19   NEXT APPOINTMENT DATE: 02/02/2020    LAST REFILL:07//28/21  QTY:90 Marcelyn Ditty

## 2020-02-02 ENCOUNTER — Other Ambulatory Visit: Payer: Self-pay

## 2020-02-02 ENCOUNTER — Encounter: Payer: Self-pay | Admitting: Family Medicine

## 2020-02-02 ENCOUNTER — Ambulatory Visit (INDEPENDENT_AMBULATORY_CARE_PROVIDER_SITE_OTHER): Payer: Medicare Other | Admitting: Family Medicine

## 2020-02-02 VITALS — BP 122/74 | HR 77 | Temp 98.1°F | Ht 65.0 in | Wt 237.4 lb

## 2020-02-02 DIAGNOSIS — E1159 Type 2 diabetes mellitus with other circulatory complications: Secondary | ICD-10-CM

## 2020-02-02 DIAGNOSIS — E785 Hyperlipidemia, unspecified: Secondary | ICD-10-CM

## 2020-02-02 DIAGNOSIS — C50911 Malignant neoplasm of unspecified site of right female breast: Secondary | ICD-10-CM

## 2020-02-02 DIAGNOSIS — C50912 Malignant neoplasm of unspecified site of left female breast: Secondary | ICD-10-CM

## 2020-02-02 DIAGNOSIS — E1169 Type 2 diabetes mellitus with other specified complication: Secondary | ICD-10-CM

## 2020-02-02 DIAGNOSIS — E119 Type 2 diabetes mellitus without complications: Secondary | ICD-10-CM | POA: Diagnosis not present

## 2020-02-02 DIAGNOSIS — Z23 Encounter for immunization: Secondary | ICD-10-CM | POA: Diagnosis not present

## 2020-02-02 DIAGNOSIS — I152 Hypertension secondary to endocrine disorders: Secondary | ICD-10-CM

## 2020-02-02 DIAGNOSIS — Z0000 Encounter for general adult medical examination without abnormal findings: Secondary | ICD-10-CM

## 2020-02-02 DIAGNOSIS — I10 Essential (primary) hypertension: Secondary | ICD-10-CM

## 2020-02-02 NOTE — Progress Notes (Signed)
Phone [phone]   Subjective:  Patient presents today for their annual physical. Chief complaint-noted.   See problem oriented charting- ROS- full  review of systems was completed and negative except for: ongoing ack pain, headaches not new, leg swelling in ankles  The following were reviewed and entered/updated in epic: Past Medical History:  Diagnosis Date   Arthritis    Cancer ([place]) 2015   BREAST   Diabetes mellitus without complication ([place])    Frequent headaches    long term issues. saw neurology in the past. rarely takes rx- excedrin if bad   Hyperlipidemia    Hypertension    Patient Active Problem List   Diagnosis Date Noted   Breast cancer ([place]) 12/13/2014    Priority: High   Morbid obesity ([place]) 08/15/2014    Priority: High   Diabetes ([place]) 03/30/2012    Priority: High   Frequent headaches     Priority: Medium   Hypertension associated with diabetes ([place]) 03/30/2012    Priority: Medium   Hyperlipidemia associated with type 2 diabetes mellitus ([place]) 03/30/2012    Priority: Medium   Low back pain 12/26/2016    Priority: Low   Past Surgical History:  Procedure Laterality Date   BREAST SURGERY     lumpectomy bilateral, biopsy precceeded   CESAREAN SECTION     x2   ELBOW BURSA SURGERY Right    FOOT SURGERY     bone spurs    hernia groin left     as child   NASAL POLYP EXCISION     TOTAL KNEE ARTHROPLASTY Bilateral 2008    Family History  Problem Relation Age of Onset   Hyperlipidemia Mother        5 in 2018   Atrial fibrillation Mother    Hypertension Father    Parkinson's disease Father    Healthy Sister    Hypertension Brother        overweight   Prostate cancer Brother    Breast cancer Maternal Grandmother        mets- led to death. great grandmother had stroke young.    Heart disease Paternal Grandmother        pacemaker   Diabetes Maternal Uncle    Hypothyroidism Sister        after removal     Medications- reviewed and updated Current Outpatient Medications  Medication Sig Dispense Refill   amLODipine (NORVASC) 5 MG tablet TAKE 1 TABLET BY MOUTH  DAILY 90 tablet 3   aspirin 81 MG tablet Take 81 mg by mouth daily.     fluticasone (FLONASE) 50 MCG/ACT nasal spray USE 2 SPRAYS INTO EACH NOSTRIL EVERY DAY 48 g 2   hydrochlorothiazide (HYDRODIURIL) 25 MG tablet TAKE 1 TABLET BY MOUTH  DAILY 90 tablet 3   letrozole (FEMARA) 2.5 MG tablet Take 2.5 mg by mouth daily.     losartan (COZAAR) 100 MG tablet TAKE 1 TABLET BY MOUTH  DAILY 90 tablet 3   melatonin 5 MG TABS Take 1 tablet by mouth at bedtime.     metFORMIN (GLUCOPHAGE) 1000 MG tablet TAKE 1 TABLET BY MOUTH  TWICE DAILY WITH MEALS (Patient taking differently: Take 1,000 mg by mouth daily with breakfast. ) 180 tablet 3   methocarbamol (ROBAXIN) 500 MG tablet Take 1 tablet by mouth three times daily. 90 tablet 1   pravastatin (PRAVACHOL) 40 MG tablet TAKE 1 TABLET BY MOUTH  DAILY 90 tablet 3   VITAMIN D PO Take 1 tablet by  mouth daily.     No current facility-administered medications for this visit.    Allergies-reviewed and updated Allergies  Allergen Reactions   Ace Inhibitors     Patient developed a cough with use of ACE inhibitors.   Sulfa Antibiotics     Social History   Social History Narrative   Married 1975. 2 sons. 4 grandkids- all local (21 to 10 in 2018)      Pianist in church.    2 years of college.       Hobbies: enjoys watching football (since radiation)- enjoys NFL, music, movies, pool in back yard, grandkids   Objective  Objective:  BP 122/74    Pulse 77    Temp 98.1 F (36.7 C) (Temporal)    Ht 5\' 5"  (1.651 m)    Wt 237 lb 6.4 oz (107.7 kg)    SpO2 97%    BMI 39.Tammy kg/m  Gen: NAD, resting comfortably HEENT: Mucous membranes are moist. Oropharynx normal Neck: no thyromegaly CV: RRR no murmurs rubs or gallops Lungs: CTAB no crackles, wheeze, rhonchi Abdomen:  soft/nontender/nondistended/normal bowel sounds. No rebound or guarding. Diastasis recti noted Ext: trace edema bilateral- slightly worse on left (compression stockings usually help) Skin: warm, dry Neuro: grossly normal, moves all extremities, PERRLA    Diabetic Foot Exam - Simple   Simple Foot Form Visual Inspection No deformities, no ulcerations, no other skin breakdown bilaterally: Yes Sensation Testing Intact to touch and monofilament testing bilaterally: Yes Pulse Check Posterior Tibialis and Dorsalis pulse intact bilaterally: Yes Comments        Assessment and Plan   69 y.o. Harris presenting for annual physical.  Health Maintenance counseling: 1. Anticipatory guidance: Patient counseled regarding regular dental exams - ecnouraged q6 months, eye exams - needs to schedule,  avoiding smoking and second hand smoke, limiting alcohol to 1 beverage per day- doesn't drink at all lately.   2. Risk factor reduction:  Advised patient of need for regular exercise and diet rich and fruits and vegetables to reduce risk of heart attack and stroke. Exercise- She walks with her husband at least 1-2 times a day for 10-15 minutes . Diet- Patient doesn't eat after 7pm- has loosened some.  Has been able to maintain prior weight loss despite recent anniversary. Still morbidly obese since BMI over 35 with DM and HTN Wt Readings from Last 3 Encounters:  02/02/20 237 lb 6.4 oz (107.7 kg)  08/02/19 235 lb (106.6 kg)  02/01/19 263 lb (119.3 kg)  3. Immunizations/screenings/ancillary studies- high dose flu shot today. Discussed covid booster as well  Immunization History  Administered Date(s) Administered   Influenza Split 03/30/2012   Influenza, Quadrivalent, Recombinant, Inj, Pf 03/26/2017   Influenza,inj,Quad PF,6+ Mos 01/11/2013, 12/20/2013, 03/04/2016, 01/28/2018   Influenza-Unspecified 01/11/2015, 02/01/2019   PFIZER SARS-COV-2 Vaccination 07/02/2019, 07/26/2019   Pneumococcal  Conjugate-13 08/15/2014   Pneumococcal Polysaccharide-23 06/23/2016   Tdap 08/26/2011  4. Cervical cancer screening- past age based screening recommendations.  5. Breast cancer screening-breast cancer history synchronous bilateral treated with lumpectomy and radiation and continues to follow-up with oncology at [facility] and remains on letrozole.  And mammogram Due on 02/09/2020 on our chart but gets yearly- scheduled early October- asked her to have them send [place] a copy  6. Colon cancer screening - Cologuard due on 02/02/2021-normal in 2019 7. Skin cancer screening- does not see dermatology. advised regular sunscreen use. Denies worrisome, changing, or new skin lesions.  8. Birth control/STD check- monogamous and postmenopausal 9.  Osteoporosis screening at 38- normal [facility] 2019. Consider repeat 2024 but honestly with normal result will discuss at that time -Non smoker  Status of chronic or acute concerns   #Hyponatremia-HCTZ likely contributes-continue to trend with labs today Lab Results  Component Value Date   NA 132 (L) 08/02/2019   K 4.4 08/02/2019   CO2 31 08/02/2019   GLUCOSE 103 (H) 08/02/2019   BUN 8 08/02/2019   CREATININE 0.64 08/02/2019   CALCIUM 10.3 08/02/2019   GFRNONAA 94 06/23/2016   GFRAA 108 06/23/2016   # Diabetes S: Medication:Metformin 1000 mg twice daily-reduce to once a day last visit CBGs- does not check. Has meter on hand Exercise and diet- see above Lab Results  Component Value Date   HGBA1C 5.4 08/02/2019   HGBA1C 5.8 (A) 02/01/2019   HGBA1C 7.9 (H) 07/29/2018   A/P: hopefully a1c is still well controlled- if under 6 could try 500mg  metformin (she has excess)   #hypertension S: compliant with hydrochlorothiazide 25 mg, losartan 100 mg, amlodipine 5 mg A/P: very well Controlled today-continue current medication   #hyperlipidemia S: compliant with pravastatin 40 mg  Lab Results  Component Value Date   CHOL 151 02/01/2019   HDL 49.00  02/01/2019   LDLCALC 76 02/01/2019   LDLDIRECT 100.0 07/29/2018   TRIG 132.0 02/01/2019   CHOLHDL 3 02/01/2019    A/P: LDL very close to goal last year at 76-update lipid panel today after weight loss  #Low back pain S: Patient with history of degenerative disc disease based on MRI of lumbar spine in the past.  Intermittent flares at times and uses methocarbamol- has had to use several times A/P: stable- continue current medicines   # headaches frequent- very sparing excedrin to avoid rebound headaches . Thinks barometric pressure is a trigger  #numbness in lateral thigh if on feet for a while about twice a month- could be mild meralgia paresthetica  Recommended follow up: Return in about 6 months (around 08/01/2020) for follow up- or sooner if needed.  Lab/Order associations: fasting   ICD-10-CM   1. Preventative health care  Z00.00   2. Hypertension associated with diabetes ([place])  E11.59    I10   3. Type 2 diabetes mellitus without complication, without long-term current use of insulin (HCC)  E11.9   4. Hyperlipidemia associated with type 2 diabetes mellitus ([place])  E11.69    E78.5   5. Morbid obesity ([place])  E66.01   6. Bilateral malignant neoplasm of breast in [place], unspecified estrogen receptor status, unspecified site of breast ([place])  C50.911    C50.912     No orders of the defined types were placed in this encounter.   Return precautions advised.  Garret Reddish, MD

## 2020-02-02 NOTE — Patient Instructions (Addendum)
Health Maintenance Due  Topic Date Due  . OPHTHALMOLOGY EXAM He has not had this done-please schedule diabetic eye exam and have them send [place] a copy Never done  . INFLUENZA VACCINE In office flu shot today high-dose 12/11/2019   Please have them forward [place] a copy of mammogram  Can schedule medicare wellness visit with nurse Otila Kluver by phone at the desk  Please stop by lab before you go If you have mychart- we will send your results within 3 business days of [place] receiving them.  If you do not have mychart- we will call you about results within 5 business days of [place] receiving them.  *please note we are currently using [facility] which has a longer processing time than [place] typically so labs may not come back as quickly as in the past *please also note that you will see labs on mychart as soon as they post. I will later go in and write notes on them- will say "notes from Dr. Yong Channel"

## 2020-02-02 NOTE — Addendum Note (Signed)
Addended by: Thomes Cake on: 02/02/2020 11:54 AM   Modules accepted: Orders

## 2020-02-03 LAB — CBC
HCT: 42.3 % (ref 35.0–45.0)
Hemoglobin: 14.1 g/dL (ref 11.7–15.5)
MCH: 29.6 pg (ref 27.0–33.0)
MCHC: 33.3 g/dL (ref 32.0–36.0)
MCV: 88.7 fL (ref 80.0–100.0)
MPV: 10.5 fL (ref 7.5–12.5)
Platelets: 331 10*3/uL (ref 140–400)
RBC: 4.77 10*6/uL (ref 3.80–5.10)
RDW: 11.9 % (ref 11.0–15.0)
WBC: 8 10*3/uL (ref 3.8–10.8)

## 2020-02-03 LAB — COMPLETE METABOLIC PANEL WITHOUT GFR
AG Ratio: 1.8 (calc) (ref 1.0–2.5)
ALT: 19 U/L (ref 6–29)
AST: 16 U/L (ref 10–35)
Albumin: 4.2 g/dL (ref 3.6–5.1)
Alkaline phosphatase (APISO): 70 U/L (ref 37–153)
BUN: 9 mg/dL (ref 7–25)
CO2: 30 mmol/L (ref 20–32)
Calcium: 10.1 mg/dL (ref 8.6–10.4)
Chloride: 98 mmol/L (ref 98–110)
Creat: 0.61 mg/dL (ref 0.50–0.99)
GFR, Est African American: 109 mL/min/1.73m2
GFR, Est Non African American: 94 mL/min/1.73m2
Globulin: 2.3 g/dL (ref 1.9–3.7)
Glucose, Bld: 99 mg/dL (ref 65–99)
Potassium: 4.1 mmol/L (ref 3.5–5.3)
Sodium: 135 mmol/L (ref 135–146)
Total Bilirubin: 0.6 mg/dL (ref 0.2–1.2)
Total Protein: 6.5 g/dL (ref 6.1–8.1)

## 2020-02-03 LAB — HEMOGLOBIN A1C
Hgb A1c MFr Bld: 5.8 % of total Hgb — ABNORMAL HIGH (ref <5.7)
Mean Plasma Glucose: 120 (calc)
eAG (mmol/L): 6.6 (calc)

## 2020-02-03 LAB — LIPID PANEL
Cholesterol: 184 mg/dL
HDL: 58 mg/dL
LDL Cholesterol (Calc): 103 mg/dL — ABNORMAL HIGH
Non-HDL Cholesterol (Calc): 126 mg/dL
Total CHOL/HDL Ratio: 3.2 (calc)
Triglycerides: 126 mg/dL

## 2020-02-03 NOTE — Progress Notes (Unsigned)
Foot exam was done at time of visit but was not registering- reentered today

## 2020-02-20 DIAGNOSIS — N6002 Solitary cyst of left breast: Secondary | ICD-10-CM | POA: Diagnosis not present

## 2020-02-20 DIAGNOSIS — C50911 Malignant neoplasm of unspecified site of right female breast: Secondary | ICD-10-CM | POA: Diagnosis not present

## 2020-02-20 DIAGNOSIS — C50912 Malignant neoplasm of unspecified site of left female breast: Secondary | ICD-10-CM | POA: Diagnosis not present

## 2020-02-20 DIAGNOSIS — C50212 Malignant neoplasm of upper-inner quadrant of left female breast: Secondary | ICD-10-CM | POA: Diagnosis not present

## 2020-02-20 DIAGNOSIS — N6322 Unspecified lump in the left breast, upper inner quadrant: Secondary | ICD-10-CM | POA: Diagnosis not present

## 2020-02-20 DIAGNOSIS — Z79811 Long term (current) use of aromatase inhibitors: Secondary | ICD-10-CM | POA: Diagnosis not present

## 2020-02-20 DIAGNOSIS — Z923 Personal history of irradiation: Secondary | ICD-10-CM | POA: Diagnosis not present

## 2020-02-20 DIAGNOSIS — R928 Other abnormal and inconclusive findings on diagnostic imaging of breast: Secondary | ICD-10-CM | POA: Diagnosis not present

## 2020-02-20 DIAGNOSIS — Z9889 Other specified postprocedural states: Secondary | ICD-10-CM | POA: Diagnosis not present

## 2020-02-20 DIAGNOSIS — Z853 Personal history of malignant neoplasm of breast: Secondary | ICD-10-CM | POA: Diagnosis not present

## 2020-02-20 DIAGNOSIS — Z170 Estrogen receptor positive status [ER+]: Secondary | ICD-10-CM | POA: Diagnosis not present

## 2020-03-01 ENCOUNTER — Telehealth: Payer: Self-pay | Admitting: Family Medicine

## 2020-03-01 NOTE — Telephone Encounter (Signed)
Left message for patient to call back and schedule Medicare Annual Wellness Visit (AWV) either virtually/audio only OR in office. Whatever the patients preference is.  Welcome exam 02/01/19; please schedule at anytime with [facility]Nurse Health Advisor at [facility].  This should be a 45 minute visit.

## 2020-03-04 ENCOUNTER — Other Ambulatory Visit: Payer: Self-pay | Admitting: Family Medicine

## 2020-05-26 ENCOUNTER — Other Ambulatory Visit: Payer: Self-pay | Admitting: Family Medicine

## 2020-05-26 DIAGNOSIS — I10 Essential (primary) hypertension: Secondary | ICD-10-CM

## 2020-05-26 DIAGNOSIS — E785 Hyperlipidemia, unspecified: Secondary | ICD-10-CM

## 2020-07-02 ENCOUNTER — Other Ambulatory Visit: Payer: Self-pay | Admitting: Family Medicine

## 2020-07-28 ENCOUNTER — Other Ambulatory Visit: Payer: Self-pay | Admitting: Family Medicine

## 2020-07-31 ENCOUNTER — Telehealth: Payer: Self-pay | Admitting: Family Medicine

## 2020-07-31 NOTE — Telephone Encounter (Signed)
Left message for patient to call back and schedule Medicare Annual Wellness Visit (AWV) either virtually OR in office.   Welcome exam was 02/01/19; please schedule AWV-I at anytime with [facility]-Nurse Health Advisor at [facility].  This should be a 45 minute visit.

## 2020-08-01 NOTE — Patient Instructions (Addendum)
Health Maintenance Due  Topic Date Due  . OPHTHALMOLOGY EXAM needs to schedule. Have them send [place] a copy Never done   Could try voltaren/diclofenac gel for up to 4x a day to see if it can settle the ankle down even further  Please stop by lab before you go If you have mychart- we will send your results within 3 business days of [place] receiving them.  If you do not have mychart- we will call you about results within 5 business days of [place] receiving them.  *please also note that you will see labs on mychart as soon as they post. I will later go in and write notes on them- will say "notes from Dr. Yong Channel"  Recommended follow up: keep already scheduled physical in december

## 2020-08-01 NOTE — Progress Notes (Signed)
Phone [phone] In person visit   Subjective:   Tammy Harris is a 70 y.o. year old very pleasant female patient who presents for/with See problem oriented charting Chief Complaint  Patient presents with  . Hypertension  . Hyperlipidemia  . Diabetes   This visit occurred during the SARS-CoV-2 public health emergency.  Safety protocols were in place, including screening questions prior to the visit, additional usage of staff PPE, and extensive cleaning of exam room while observing appropriate contact time as indicated for disinfecting solutions.   Past Medical History-  Patient Active Problem List   Diagnosis Date Noted  . Breast cancer ([place]) 12/13/2014    Priority: High  . Morbid obesity ([place]) 08/15/2014    Priority: High  . Diabetes ([place]) 03/30/2012    Priority: High  . Frequent headaches     Priority: Medium  . Hypertension associated with diabetes ([place]) 03/30/2012    Priority: Medium  . Hyperlipidemia associated with type 2 diabetes mellitus ([place]) 03/30/2012    Priority: Medium  . Low back pain 12/26/2016    Priority: Low    Medications- reviewed and updated Current Outpatient Medications  Medication Sig Dispense Refill  . amLODipine (NORVASC) 5 MG tablet TAKE 1 TABLET BY MOUTH  DAILY 90 tablet 3  . aspirin 81 MG tablet Take 81 mg by mouth daily.    . fluticasone (FLONASE) 50 MCG/ACT nasal spray USE 2 SPRAYS INTO EACH NOSTRIL EVERY DAY 48 g 2  . hydrochlorothiazide (HYDRODIURIL) 25 MG tablet TAKE 1 TABLET BY MOUTH  DAILY 90 tablet 3  . losartan (COZAAR) 100 MG tablet TAKE 1 TABLET BY MOUTH  DAILY 90 tablet 3  . melatonin 5 MG TABS Take 1 tablet by mouth at bedtime.    . metFORMIN (GLUCOPHAGE) 1000 MG tablet TAKE 1 TABLET BY MOUTH  TWICE DAILY WITH MEALS (Patient taking differently: Take 500 mg by mouth daily with breakfast. Patient take half a tablet daily.) 180 tablet 3  . methocarbamol (ROBAXIN) 500 MG tablet TAKE 1 TABLET BY MOUTH 3  TIMES DAILY 90 tablet  0  . pravastatin (PRAVACHOL) 40 MG tablet TAKE 1 TABLET BY MOUTH  DAILY 90 tablet 3  . VITAMIN D PO Take 1 tablet by mouth daily.    Marland Kitchen letrozole (FEMARA) 2.5 MG tablet Take 2.5 mg by mouth daily. (Patient not taking: Reported on 08/06/2020)     No current facility-administered medications for this visit.     Objective:  BP 140/80   Pulse 71   Temp 98.3 F (36.8 C) (Temporal)   Ht 5\' 5"  (1.651 m)   Wt 247 lb 3.2 oz (112.1 kg)   SpO2 99%   BMI 41.14 kg/m  Gen: NAD, resting comfortably CV: RRR no murmurs rubs or gallops Lungs: CTAB no crackles, wheeze, rhonchi Abdomen: soft/nontender/nondistended/normal bowel sounds.  Ext: trace edema Skin: warm, dry     Assessment and Plan    # Diabetes/obesity S: Compliant with Metformin 500 mg once daily down from Metformin 1000 mg twice a day. Doesn't split well but has multiple bottles.  Exercise and diet-  Weight up 10 lbs- she feels like ankle flaring up has affected mobility - she has planned counter measures of getting out in the pool. Feels she is eating reasonably healthy- doing best balance of fish/vegetables/fruits than she ever has Lab Results  Component Value Date   HGBA1C 5.8 (H) 02/02/2020   HGBA1C 5.4 08/02/2019   HGBA1C 5.8 (A) 02/01/2019   A/P: hopefully controlled-  update a1c today. For now continue current meds. For obesity- discussed lifestyle changes   #hypertension S: compliant with hydrochlorothiazide 25 mg, losartan 100 mg, amlodipine 5 mg  -some urinary frequency BP Readings from Last 3 Encounters:  08/06/20 138/82  02/02/20 122/74  08/02/19 132/84   A/P: Stable. Continue current medications.    #hyperlipidemia S: compliant with pravastatin 40 mg- no missed doses  Lab Results  Component Value Date   CHOL 184 02/02/2020   HDL 58 02/02/2020   LDLCALC 103 (H) 02/02/2020   LDLDIRECT 100.0 07/29/2018   TRIG 126 02/02/2020   CHOLHDL 3.2 02/02/2020    A/P: recheck in 6 months with full lipid panel and gives  her more time to work on lifestyle changes with weight up some recently.   #Low back pain S: Patient with history of degenerative disc disease based on MRI of lumbar spine in the past.  Intermittent flares at times and uses methocarbamol A/P: overall stable but some things still flare up such as if sits on a seat that is not comfortable for her.    # headaches frequent- very sparing excedrin to avoid rebound headaches . Thinks barometric pressure is a trigger. Stopped letrozole December 2021- less hair falling out and fewer headaches as well  # Left ankle pain after prior ankle fracture about 5 years - been bothersome in last month but better in last few days. No recent falls or injury. Was told could have surgery if needed in past but with settling down wants to hold off.   Recommended follow up: keep CPE in september Future Appointments  Date Time Provider [place]  02/04/2021 10:40 AM Marin Olp, MD [facility]    Lab/Order associations:   ICD-10-CM   1. Hypertension associated with diabetes ([place])  E11.59    I15.2   2. Hyperlipidemia associated with type 2 diabetes mellitus ([place])  E11.69    E78.5   3. Type 2 diabetes mellitus without complication, without long-term current use of insulin (H[facility])  E11.9   4. Encounter for screening mammogram for malignant neoplasm of breast  Z12.31     No orders of the defined types were placed in this encounter.  Return precautions advised.  Garret Reddish, MD

## 2020-08-06 ENCOUNTER — Other Ambulatory Visit: Payer: Self-pay

## 2020-08-06 ENCOUNTER — Ambulatory Visit (INDEPENDENT_AMBULATORY_CARE_PROVIDER_SITE_OTHER): Payer: Medicare Other | Admitting: Family Medicine

## 2020-08-06 ENCOUNTER — Encounter: Payer: Self-pay | Admitting: Family Medicine

## 2020-08-06 VITALS — BP 138/82 | HR 71 | Temp 98.3°F | Ht 65.0 in | Wt 247.2 lb

## 2020-08-06 DIAGNOSIS — Z853 Personal history of malignant neoplasm of breast: Secondary | ICD-10-CM | POA: Diagnosis not present

## 2020-08-06 DIAGNOSIS — I152 Hypertension secondary to endocrine disorders: Secondary | ICD-10-CM | POA: Diagnosis not present

## 2020-08-06 DIAGNOSIS — E785 Hyperlipidemia, unspecified: Secondary | ICD-10-CM | POA: Diagnosis not present

## 2020-08-06 DIAGNOSIS — E119 Type 2 diabetes mellitus without complications: Secondary | ICD-10-CM

## 2020-08-06 DIAGNOSIS — Z1231 Encounter for screening mammogram for malignant neoplasm of breast: Secondary | ICD-10-CM | POA: Diagnosis not present

## 2020-08-06 DIAGNOSIS — E1169 Type 2 diabetes mellitus with other specified complication: Secondary | ICD-10-CM

## 2020-08-06 DIAGNOSIS — E1159 Type 2 diabetes mellitus with other circulatory complications: Secondary | ICD-10-CM | POA: Diagnosis not present

## 2020-08-06 LAB — COMPREHENSIVE METABOLIC PANEL
ALT: 25 U/L (ref 0–35)
AST: 19 U/L (ref 0–37)
Albumin: 4.1 g/dL (ref 3.5–5.2)
Alkaline Phosphatase: 72 U/L (ref 39–117)
BUN: 10 mg/dL (ref 6–23)
CO2: 29 mEq/L (ref 19–32)
Calcium: 9.9 mg/dL (ref 8.4–10.5)
Chloride: 97 mEq/L (ref 96–112)
Creatinine, Ser: 0.6 mg/dL (ref 0.40–1.20)
GFR: 92.93 mL/min (ref >60.00)
Glucose, Bld: 106 mg/dL — ABNORMAL HIGH (ref 70–99)
Potassium: 3.7 mEq/L (ref 3.5–5.1)
Sodium: 133 mEq/L — ABNORMAL LOW (ref 135–145)
Total Bilirubin: 0.5 mg/dL (ref 0.2–1.2)
Total Protein: 6.8 g/dL (ref 6.0–8.3)

## 2020-08-06 LAB — HEMOGLOBIN A1C: Hgb A1c MFr Bld: 6.2 % (ref 4.6–6.5)

## 2020-08-28 ENCOUNTER — Telehealth: Payer: Self-pay | Admitting: Family Medicine

## 2020-08-28 NOTE — Chronic Care Management (AMB) (Signed)
  Chronic Care Management   Outreach Note  08/28/2020 Name: Tammy Harris MRN: [id number] DOB: 04-14-1951  Referred by: Marin Olp, MD Reason for referral : No chief complaint on file.   An unsuccessful telephone outreach was attempted today. The patient was referred to the pharmacist for assistance with care management and care coordination.   Follow Up Plan:   Lauretta Grill Upstream Scheduler

## 2020-08-29 ENCOUNTER — Other Ambulatory Visit: Payer: Self-pay | Admitting: Family Medicine

## 2020-09-14 ENCOUNTER — Telehealth: Payer: Self-pay | Admitting: Family Medicine

## 2020-09-14 NOTE — Chronic Care Management (AMB) (Signed)
  Chronic Care Management   Outreach Note  09/14/2020 Name: Perina Salvaggio MRN: [id number] DOB: 08-28-1950  Referred by: Marin Olp, MD Reason for referral : No chief complaint on file.   A second unsuccessful telephone outreach was attempted today. The patient was referred to pharmacist for assistance with care management and care coordination.  Follow Up Plan:   Lauretta Grill Upstream Scheduler

## 2020-09-25 ENCOUNTER — Other Ambulatory Visit: Payer: Self-pay | Admitting: Family Medicine

## 2020-10-12 ENCOUNTER — Telehealth: Payer: Self-pay | Admitting: Family Medicine

## 2020-10-12 NOTE — Progress Notes (Signed)
  Chronic Care Management   Outreach Note  10/12/2020 Name: Tammy Harris MRN: [id number] DOB: 1950-07-07  Referred by: Marin Olp, MD Reason for referral : No chief complaint on file.   A second unsuccessful telephone outreach was attempted today. The patient was referred to pharmacist for assistance with care management and care coordination.  Follow Up Plan:   Lauretta Grill Upstream Scheduler

## 2020-10-29 ENCOUNTER — Other Ambulatory Visit: Payer: Self-pay | Admitting: Family Medicine

## 2020-11-28 ENCOUNTER — Other Ambulatory Visit: Payer: Self-pay | Admitting: Family Medicine

## 2020-12-07 ENCOUNTER — Telehealth: Payer: Self-pay

## 2020-12-07 NOTE — Telephone Encounter (Signed)
Returned the call and made the representative aware I have faxed this form regarding methocarbamol as of yesterday afternoon.

## 2020-12-07 NOTE — Telephone Encounter (Signed)
[facility], is needing to speak with someone about a prior auth for the medication methocarbamol (ROBAXIN) 500 MG tablet. They will be sending a fax over to [place].

## 2021-01-09 ENCOUNTER — Other Ambulatory Visit: Payer: Self-pay | Admitting: Family Medicine

## 2021-02-01 NOTE — Progress Notes (Signed)
Phone [phone]   Subjective:  Patient presents today for their annual physical. Chief complaint-noted.   See problem oriented charting- ROS- full  review of systems was completed and negative except for: morning cough- stable, sinus pressure depending on weather  The following were reviewed and entered/updated in epic: Past Medical History:  Diagnosis Date   Arthritis    Cancer ([place]) 2015   BREAST   Diabetes mellitus without complication ([place])    Frequent headaches    long term issues. saw neurology in the past. rarely takes rx- excedrin if bad   Hyperlipidemia    Hypertension    Patient Active Problem List   Diagnosis Date Noted   History of breast cancer 12/13/2014    Priority: High   Morbid obesity ([place]) 08/15/2014    Priority: High   Diabetes ([place]) 03/30/2012    Priority: High   Frequent headaches     Priority: Medium   Hypertension associated with diabetes ([place]) 03/30/2012    Priority: Medium   Hyperlipidemia associated with type 2 diabetes mellitus ([place]) 03/30/2012    Priority: Medium   Low back pain 12/26/2016    Priority: Low   Past Surgical History:  Procedure Laterality Date   BREAST SURGERY     lumpectomy bilateral, biopsy precceeded   CESAREAN SECTION     x2   ELBOW BURSA SURGERY Right    FOOT SURGERY     bone spurs    hernia groin left     as child   NASAL POLYP EXCISION     TOTAL KNEE ARTHROPLASTY Bilateral 2008    Family History  Problem Relation Age of Onset   Hyperlipidemia Mother        41 in 2018   Atrial fibrillation Mother    Hypertension Father    Parkinson's disease Father    Healthy Sister    Hypertension Brother        overweight   Prostate cancer Brother    Breast cancer Maternal Grandmother        mets- led to death. great grandmother had stroke young.    Heart disease Paternal Grandmother        pacemaker   Diabetes Maternal Uncle    Hypothyroidism Sister        after removal    Medications- reviewed and  updated Current Outpatient Medications  Medication Sig Dispense Refill   amLODipine (NORVASC) 5 MG tablet TAKE 1 TABLET BY MOUTH  DAILY 90 tablet 3   aspirin 81 MG tablet Take 81 mg by mouth daily.     Flaxseed, Linseed, (FLAX SEEDS PO) Take by mouth.     fluticasone (FLONASE) 50 MCG/ACT nasal spray USE 2 SPRAYS INTO EACH NOSTRIL EVERY DAY 48 g 2   hydrochlorothiazide (HYDRODIURIL) 25 MG tablet TAKE 1 TABLET BY MOUTH  DAILY 90 tablet 3   losartan (COZAAR) 100 MG tablet TAKE 1 TABLET BY MOUTH  DAILY 90 tablet 3   melatonin 5 MG TABS Take 1 tablet by mouth at bedtime.     metFORMIN (GLUCOPHAGE) 1000 MG tablet TAKE 1 TABLET BY MOUTH  TWICE DAILY WITH MEALS (Patient taking differently: Take 500 mg by mouth daily with breakfast. Patient take half a tablet daily.) 180 tablet 3   methocarbamol (ROBAXIN) 500 MG tablet TAKE 1 TABLET BY MOUTH 3  TIMES DAILY 90 tablet 0   Omega-3 Fatty Acids (FISH OIL PO) Take 2,000 mg by mouth.     pravastatin (PRAVACHOL) 40 MG tablet TAKE  1 TABLET BY MOUTH  DAILY 90 tablet 3   VITAMIN D PO Take 1 tablet by mouth daily.     No current facility-administered medications for this visit.    Allergies-reviewed and updated Allergies  Allergen Reactions   Ace Inhibitors     Patient developed a cough with use of ACE inhibitors.   Sulfa Antibiotics     Social History   Social History Narrative   Married 1975. 2 sons. 4 grandkids- all local (21 to 10 in 2018). Great grandchild in 2022!       Pianist in church.    2 years of college.       Hobbies: enjoys watching football (since radiation)- enjoys NFL, music, movies, pool in back yard, grandkids   Objective  Objective:  BP (!) 144/90   Pulse 84   Temp 98.3 F (36.8 C) (Temporal)   Ht 5\' 5"  (1.651 m)   Wt 259 lb 6.4 oz (117.7 kg)   SpO2 98%   BMI 43.17 kg/m  Gen: NAD, resting comfortably HEENT: Mucous membranes are moist. Oropharynx normal Neck: no thyromegaly CV: RRR no murmurs rubs or gallops Lungs:  CTAB no crackles, wheeze, rhonchi Abdomen: soft/nontender/nondistended/normal bowel sounds. No rebound or guarding.  Ext: trace edema left > R Skin: warm, dry Neuro: grossly normal, moves all extremities, PERRLA Msk: no significant reproducible pain on hip exam-reasonable ROM- does have mild pain with stench field testing but would not call diagnostic   Diabetic Foot Exam - Simple   Simple Foot Form Diabetic Foot exam was performed with the following findings: Yes 02/04/2021 11:11 AM  Visual Inspection No deformities, no ulcerations, no other skin breakdown bilaterally: Yes Sensation Testing Intact to touch and monofilament testing bilaterally: Yes Pulse Check Posterior Tibialis and Dorsalis pulse intact bilaterally: Yes Comments        Assessment and Plan   70 y.o. female presenting for annual physical.  Health Maintenance counseling: 1. Anticipatory guidance: Patient counseled regarding regular dental exams -q6 months planned, eye exams - yearly planned,  avoiding smoking and second hand smoke , limiting alcohol to 1 beverage per day-very rare intake.  No illicit drugs 2. Risk factor reduction:  Advised patient of need for regular exercise and diet rich and fruits and vegetables to reduce risk of heart attack and stroke. Exercise-was walking with husband last year 1-2 times a day for 10 to 15 minutes- keeping that up .  Diet-has done well with limiting meals and not eating after 7 PM in the past- plans to restart- recent drop off- harder with kids over in summer.  Still with  improvement from recent peak weight 263 in 2020.  Wt Readings from Last 3 Encounters:  02/04/21 259 lb 6.4 oz (117.7 kg)  08/06/20 247 lb 3.2 oz (112.1 kg)  02/02/20 237 lb 6.4 oz (107.7 kg)  3. Immunizations/screenings/ancillary studies DISCUSSED:  -Shingrix vaccination #1 - discussed completing a pharmacy -prevnar 20 likely next year -COVID-19 omicron vaccination repeat planned - recommended this at the  pharmacy -Flu vaccination (last one 09/21) - high-dose flu shot today Immunization History  Administered Date(s) Administered   Fluad Quad(high Dose 65+) 02/02/2020   Influenza Split 03/30/2012   Influenza, Quadrivalent, Recombinant, Inj, Pf 03/26/2017   Influenza,inj,Quad PF,6+ Mos 01/11/2013, 12/20/2013, 03/04/2016, 01/28/2018   Influenza-Unspecified 01/11/2015, 02/01/2019   PFIZER(Purple Top)SARS-COV-2 Vaccination 07/02/2019, 07/26/2019, 02/28/2020   Pneumococcal Conjugate-13 08/15/2014   Pneumococcal Polysaccharide-23 06/23/2016   Tdap 08/26/2011  4. Cervical cancer screening- past age based screening  recommendations. Still sees GYN 5. Breast cancer screening-patient with breast cancer history-synchronous bilateral treated with lumpectomy and radiation and continues follow-up with oncology at [facility] and prior on letrozole- now off.  Receives mammograms yearly-most recently dated 02/20/20- scheduled for October for 3d mammogram- asked her to have them send [place] a copy 6. Colon cancer screening - 02/02/18 cologuard- due now for repeat- ordered today 7. Skin cancer screening-does not see dermatology. advised regular sunscreen use. Denies worrisome, changing, or new skin lesions.  8. Birth control/STD check-monogamous/postmenopausal 9. Osteoporosis screening at 39- DEXA scan completed 05/22/2019noted as normal-consider repeat 2024 -Never smoker  Status of chronic or acute concerns   # Diabetes/obesity S: Compliant with Metformin 500 mg daily down from metformin 1000 mg twice daily Exercise and diet- see above Lab Results  Component Value Date   HGBA1C 6.2 08/06/2020   HGBA1C 5.8 (H) 02/02/2020   HGBA1C 5.4 08/02/2019   A/P: hopefully controlled - update a1c- weight up some.  -Diabetic eye exam- encouraged to schedule   #hypertension-occasional hyponatremia on HCTZ S: compliant with hydrochlorothiazide 25 mg daily, losartan 100 mg, daily amlodipine 5 mg daily  Home readings #s:  not recently checked BP Readings from Last 3 Encounters:  02/04/21 (!) 144/90  08/06/20 138/82  02/02/20 122/74  A/P: before making any changes I want her to do some home monitoring for the next 2 weeks and then update me by mychart. For now continue current meds- only mild poor control but last 2 visits had been at goal. Work on lifestyle changes.   #hyperlipidemia S: compliant with pravastatin 40 mg daily- had no missed doses   -also on flxseed and fish oil  Lab Results  Component Value Date   CHOL 184 02/02/2020   HDL 58 02/02/2020   LDLCALC 103 (H) 02/02/2020   LDLDIRECT 100.0 07/29/2018   TRIG 126 02/02/2020   CHOLHDL 3.2 02/02/2020   A/P: We discussed targeting LDL at least below 100-update lipid panel today- had been trying to work on lifestyles  #Low back pain Patient with history of degenerative disc disease based on MRI of lumbar spine in the past.  Intermittent flares at times and used methocarbamol- no recent significant issues   # headaches frequent- very spared excedrin to avoid rebound headaches . Thought barometric pressure was a trigger. Stopped letrozole December 2021- less hair falling out and fewer headaches as well. Still knows when its going to rain- gtes a headache.    # Left ankle pain after prior ankle fracture about 5 years - been bothersome in last month but better in last few days. No recent falls or injury. Was told could have surgery if needed in past but with settling down wanted to hold off.  - not bad lately- has almost forgotten about it- still tries to wear compressoin on the left  #intermittent catch in right groin with pain- finding certain positions to avoid. Sharp and can take breath away. History of bone spurs in left shoulder and back- feels similar. Not constant- more certain positions.  - slightly better lately  - offered referral to sports medicine- declines for now   Recommended follow up: Return in about 6 months (around 08/04/2021) for  follow-up or sooner if needed..  Lab/Order associations:NOT fasting   ICD-10-CM   1. Preventative health care  Z00.00     2. Hypertension associated with diabetes ([place])  E11.59    I15.2     3. Hyperlipidemia associated with type 2 diabetes mellitus ([place])  E11.69 CBC with Differential/Platelet   E78.5 Comprehensive metabolic panel    Lipid panel    4. Type 2 diabetes mellitus without complication, without long-term current use of insulin ([facility])  E11.9 CBC with Differential/Platelet    Comprehensive metabolic panel    Lipid panel    Hemoglobin A1c    5. Chronic bilateral low back pain without sciatica  M54.50    G89.29     6. Morbid obesity ([place])  E66.01     7. High risk medication use  Z79.899 B12    8. Screen for colon cancer  Z12.11 Cologuard      No orders of the defined types were placed in this encounter.  I,Jada Bradford,acting as a scribe for Garret Reddish, MD.,have documented all relevant documentation on the behalf of Garret Reddish, MD,as directed by  Garret Reddish, MD while in the presence of Garret Reddish, MD.  I, Garret Reddish, MD, have reviewed all documentation for this visit. The documentation on 02/04/21 for the exam, diagnosis, procedures, and orders are all accurate and complete.  Return precautions advised.  Garret Reddish, MD

## 2021-02-04 ENCOUNTER — Encounter: Payer: Self-pay | Admitting: Family Medicine

## 2021-02-04 ENCOUNTER — Ambulatory Visit (INDEPENDENT_AMBULATORY_CARE_PROVIDER_SITE_OTHER): Payer: Medicare Other | Admitting: Family Medicine

## 2021-02-04 ENCOUNTER — Other Ambulatory Visit: Payer: Self-pay

## 2021-02-04 VITALS — BP 144/90 | HR 84 | Temp 98.3°F | Ht 65.0 in | Wt 259.4 lb

## 2021-02-04 DIAGNOSIS — Z0000 Encounter for general adult medical examination without abnormal findings: Secondary | ICD-10-CM

## 2021-02-04 DIAGNOSIS — E119 Type 2 diabetes mellitus without complications: Secondary | ICD-10-CM

## 2021-02-04 DIAGNOSIS — Z23 Encounter for immunization: Secondary | ICD-10-CM

## 2021-02-04 DIAGNOSIS — I152 Hypertension secondary to endocrine disorders: Secondary | ICD-10-CM

## 2021-02-04 DIAGNOSIS — M545 Low back pain, unspecified: Secondary | ICD-10-CM

## 2021-02-04 DIAGNOSIS — Z1211 Encounter for screening for malignant neoplasm of colon: Secondary | ICD-10-CM

## 2021-02-04 DIAGNOSIS — Z79899 Other long term (current) drug therapy: Secondary | ICD-10-CM

## 2021-02-04 DIAGNOSIS — E1169 Type 2 diabetes mellitus with other specified complication: Secondary | ICD-10-CM

## 2021-02-04 DIAGNOSIS — E785 Hyperlipidemia, unspecified: Secondary | ICD-10-CM

## 2021-02-04 DIAGNOSIS — G8929 Other chronic pain: Secondary | ICD-10-CM

## 2021-02-04 DIAGNOSIS — E1159 Type 2 diabetes mellitus with other circulatory complications: Secondary | ICD-10-CM

## 2021-02-04 LAB — CBC WITH DIFFERENTIAL/PLATELET
Basophils Absolute: 0.1 10*3/uL (ref 0.0–0.1)
Basophils Relative: 0.8 % (ref 0.0–3.0)
Eosinophils Absolute: 0.1 10*3/uL (ref 0.0–0.7)
Eosinophils Relative: 1.8 % (ref 0.0–5.0)
HCT: 41.9 % (ref 36.0–46.0)
Hemoglobin: 13.7 g/dL (ref 12.0–15.0)
Lymphocytes Relative: 26.9 % (ref 12.0–46.0)
Lymphs Abs: 2.2 10*3/uL (ref 0.7–4.0)
MCHC: 32.7 g/dL (ref 30.0–36.0)
MCV: 88.8 fl (ref 78.0–100.0)
Monocytes Absolute: 0.6 10*3/uL (ref 0.1–1.0)
Monocytes Relative: 7.8 % (ref 3.0–12.0)
Neutro Abs: 5.2 10*3/uL (ref 1.4–7.7)
Neutrophils Relative %: 62.7 % (ref 43.0–77.0)
Platelets: 313 10*3/uL (ref 150.0–400.0)
RBC: 4.72 Mil/uL (ref 3.87–5.11)
RDW: 13.4 % (ref 11.5–15.5)
WBC: 8.2 10*3/uL (ref 4.0–10.5)

## 2021-02-04 LAB — COMPREHENSIVE METABOLIC PANEL
ALT: 24 U/L (ref 0–35)
AST: 18 U/L (ref 0–37)
Albumin: 4.1 g/dL (ref 3.5–5.2)
Alkaline Phosphatase: 58 U/L (ref 39–117)
BUN: 8 mg/dL (ref 6–23)
CO2: 28 mEq/L (ref 19–32)
Calcium: 9.8 mg/dL (ref 8.4–10.5)
Chloride: 97 mEq/L (ref 96–112)
Creatinine, Ser: 0.7 mg/dL (ref 0.40–1.20)
GFR: 89.23 mL/min (ref >60.00)
Glucose, Bld: 111 mg/dL — ABNORMAL HIGH (ref 70–99)
Potassium: 4 mEq/L (ref 3.5–5.1)
Sodium: 134 mEq/L — ABNORMAL LOW (ref 135–145)
Total Bilirubin: 0.5 mg/dL (ref 0.2–1.2)
Total Protein: 6.8 g/dL (ref 6.0–8.3)

## 2021-02-04 LAB — LIPID PANEL
Cholesterol: 183 mg/dL (ref 0–200)
HDL: 59 mg/dL (ref >39.00)
LDL Cholesterol: 87 mg/dL (ref 0–99)
NonHDL: 123.62
Total CHOL/HDL Ratio: 3
Triglycerides: 181 mg/dL — ABNORMAL HIGH (ref 0.0–149.0)
VLDL: 36.2 mg/dL (ref 0.0–40.0)

## 2021-02-04 LAB — HEMOGLOBIN A1C: Hgb A1c MFr Bld: 6.7 % — ABNORMAL HIGH (ref 4.6–6.5)

## 2021-02-04 LAB — VITAMIN B12: Vitamin B-12: 383 pg/mL (ref 211–911)

## 2021-02-04 NOTE — Patient Instructions (Addendum)
Health Maintenance Due  Topic Date Due   OPHTHALMOLOGY EXAM -   - Please scheduled at her earliest convenience and please have them send [place] a copy.   Never done   Zoster Vaccines- Shingrix (1 of 2)   -Please check with your pharmacy to see if they have the shingrix vaccine. If they do- please get this immunization and update [place] by phone call or mychart with dates you receive the vaccine. It will be cheaper at the local pharmacy.   Never done   COVID-19 Vaccine (4 - Booster for [employer] series)    -Patient will get the omicron variant shot at her local pharmacy at her earliest convenience.   05/22/2020   INFLUENZA VACCINE-   - High dose flu shot done today.   12/10/2020   Fecal DNA (Cologuard)   - Please let us know if you have not received your Cologuard in 3 weeks.   02/02/2021   Please send [place] a copy of your upcoming mammogram.   Please stop by lab before you go If you have mychart- we will send your results within 3 business days of [place] receiving them.  If you do not have mychart- we will call you about results within 5 business days of [place] receiving them.  *please also note that you will see labs on mychart as soon as they post. I will later go in and write notes on them- will say "notes from Dr. Yong Channel"  I would love for you to do home monitoring for your blood pressure for 2 weeks and please update me on MyChart. Holding off on med changes for now  Recommended follow up: Return in about 6 months (around 08/04/2021) for follow-up or sooner if needed..If right groin pain worsens, please let me know- can refer to sports med

## 2021-02-23 ENCOUNTER — Other Ambulatory Visit: Payer: Self-pay | Admitting: Family Medicine

## 2021-02-25 DIAGNOSIS — Z79811 Long term (current) use of aromatase inhibitors: Secondary | ICD-10-CM | POA: Diagnosis not present

## 2021-02-25 DIAGNOSIS — C50512 Malignant neoplasm of lower-outer quadrant of left female breast: Secondary | ICD-10-CM | POA: Diagnosis not present

## 2021-02-25 DIAGNOSIS — Z08 Encounter for follow-up examination after completed treatment for malignant neoplasm: Secondary | ICD-10-CM | POA: Diagnosis not present

## 2021-02-25 DIAGNOSIS — Z170 Estrogen receptor positive status [ER+]: Secondary | ICD-10-CM | POA: Diagnosis not present

## 2021-02-25 DIAGNOSIS — Z853 Personal history of malignant neoplasm of breast: Secondary | ICD-10-CM | POA: Diagnosis not present

## 2021-02-25 DIAGNOSIS — Z923 Personal history of irradiation: Secondary | ICD-10-CM | POA: Diagnosis not present

## 2021-02-25 DIAGNOSIS — C50111 Malignant neoplasm of central portion of right female breast: Secondary | ICD-10-CM | POA: Diagnosis not present

## 2021-02-25 LAB — HM MAMMOGRAPHY

## 2021-03-23 ENCOUNTER — Other Ambulatory Visit: Payer: Self-pay | Admitting: Family Medicine

## 2021-04-08 DIAGNOSIS — Z1211 Encounter for screening for malignant neoplasm of colon: Secondary | ICD-10-CM | POA: Diagnosis not present

## 2021-04-11 LAB — COLOGUARD: COLOGUARD: NEGATIVE

## 2021-04-17 ENCOUNTER — Other Ambulatory Visit: Payer: Self-pay | Admitting: Family Medicine

## 2021-05-09 ENCOUNTER — Other Ambulatory Visit: Payer: Self-pay | Admitting: Family Medicine

## 2021-05-09 DIAGNOSIS — I10 Essential (primary) hypertension: Secondary | ICD-10-CM

## 2021-05-09 DIAGNOSIS — E785 Hyperlipidemia, unspecified: Secondary | ICD-10-CM

## 2021-05-13 ENCOUNTER — Other Ambulatory Visit: Payer: Self-pay | Admitting: Family Medicine

## 2021-05-28 ENCOUNTER — Telehealth: Payer: Self-pay | Admitting: Family Medicine

## 2021-05-28 NOTE — Telephone Encounter (Signed)
Copied from [place] [id number]. Topic: Medicare AWV >> May 28, 2021 11:01 AM Harris-Coley, Hannah Beat wrote: Reason for CRM: Left message for patient to schedule Annual Wellness Visit.  Please schedule with Nurse Health Advisor Charlott Rakes, RN at [facility].  Please call [phone] ask for [facility]

## 2021-06-10 ENCOUNTER — Other Ambulatory Visit: Payer: Self-pay | Admitting: Family Medicine

## 2021-07-05 ENCOUNTER — Other Ambulatory Visit: Payer: Self-pay | Admitting: Family Medicine

## 2021-07-17 NOTE — Progress Notes (Incomplete)
Phone [phone] In person visit   Subjective:   Tammy Harris is a 71 y.o. year old very pleasant female patient who presents for/with See problem oriented charting No chief complaint on file.   This visit occurred during the SARS-CoV-2 public health emergency.  Safety protocols were in place, including screening questions prior to the visit, additional usage of staff PPE, and extensive cleaning of exam room while observing appropriate contact time as indicated for disinfecting solutions.   Past Medical History-  Patient Active Problem List   Diagnosis Date Noted   Low back pain 12/26/2016   Frequent headaches    History of breast cancer 12/13/2014   Morbid obesity ([place]) 08/15/2014   Hypertension associated with diabetes ([place]) 03/30/2012   Hyperlipidemia associated with type 2 diabetes mellitus ([place]) 03/30/2012   Diabetes ([place]) 03/30/2012    Medications- reviewed and updated Current Outpatient Medications  Medication Sig Dispense Refill   amLODipine (NORVASC) 5 MG tablet TAKE 1 TABLET BY MOUTH  DAILY 90 tablet 3   aspirin 81 MG tablet Take 81 mg by mouth daily.     Flaxseed, Linseed, (FLAX SEEDS PO) Take by mouth.     fluticasone (FLONASE) 50 MCG/ACT nasal spray USE 2 SPRAYS INTO EACH NOSTRIL EVERY DAY 48 g 2   hydrochlorothiazide (HYDRODIURIL) 25 MG tablet TAKE 1 TABLET BY MOUTH  DAILY 90 tablet 3   losartan (COZAAR) 100 MG tablet TAKE 1 TABLET BY MOUTH  DAILY 90 tablet 3   melatonin 5 MG TABS Take 1 tablet by mouth at bedtime.     metFORMIN (GLUCOPHAGE) 1000 MG tablet TAKE 1 TABLET BY MOUTH  TWICE DAILY WITH MEALS (Patient taking differently: Take 500 mg by mouth daily with breakfast. Patient take half a tablet daily.) 180 tablet 3   methocarbamol (ROBAXIN) 500 MG tablet TAKE 1 TABLET BY MOUTH 3 TIMES  DAILY 90 tablet 0   Omega-3 Fatty Acids (FISH OIL PO) Take 2,000 mg by mouth.     pravastatin (PRAVACHOL) 40 MG tablet TAKE 1 TABLET BY MOUTH  DAILY 90 tablet 3    VITAMIN D PO Take 1 tablet by mouth daily.     No current facility-administered medications for this visit.     Objective:  There were no vitals taken for this visit. Gen: NAD, resting comfortably CV: RRR no murmurs rubs or gallops Lungs: CTAB no crackles, wheeze, rhonchi Abdomen: soft/nontender/nondistended/normal bowel sounds. No rebound or guarding.  Ext: no edema Skin: warm, dry Neuro: grossly normal, moves all extremities  ***    Assessment and Plan  WTM 02/01/2019 ***  ***Hyponatremia at times-HCTZ likely contributed  # Diabetes S: Compliant with Metformin 1000 mg twice a day CBGs-*** Exercise and diet- *** Lab Results  Component Value Date   HGBA1C 6.7 (H) 02/04/2021   HGBA1C 6.2 08/06/2020   HGBA1C 5.8 (H) 02/02/2020    A/P: ***  #hypertension S: compliant with hydrochlorothiazide 25 mg daily, losartan 100 mg daily, amlodipine 5 mg daily Home readings #s: *** BP Readings from Last 3 Encounters:  02/04/21 (!) 144/90  08/06/20 138/82  02/02/20 122/74  A/P: ***   #hyperlipidemia S: compliant with pravastatin 40 mg daily Lab Results  Component Value Date   CHOL 183 02/04/2021   HDL 59.00 02/04/2021   LDLCALC 87 02/04/2021   LDLDIRECT 100.0 07/29/2018   TRIG 181.0 (H) 02/04/2021   CHOLHDL 3 02/04/2021   A/P: ***  #Low back pain S: Patient with history of degenerative disc disease based  on MRI of lumbar spine in the past. Intermittent flares at times and uses methocarbamol A/P: ***   # headaches frequent- very sparing excedrin to avoid rebound headaches . Thought barometric pressure was a trigger  Health Maintenance Due  Topic Date Due   OPHTHALMOLOGY EXAM  Never done   Zoster Vaccines- Shingrix (1 of 2) Never done   COVID-19 Vaccine (4 - Booster for Pfizer series) 04/24/2020   Pneumonia Vaccine 105+ Years old (3) 06/23/2021   Recommended follow up: No follow-ups on file. Future Appointments  Date Time Provider [place]  08/07/2021  10:40 AM Marin Olp, MD [facility]    Lab/Order associations: No diagnosis found.  No orders of the defined types were placed in this encounter.   I,Jada Bradford,acting as a scribe for Garret Reddish, MD.,have documented all relevant documentation on the behalf of Garret Reddish, MD,as directed by  Garret Reddish, MD while in the presence of Garret Reddish, MD.  *** Return precautions advised.  Burnett Corrente

## 2021-07-30 ENCOUNTER — Other Ambulatory Visit: Payer: Self-pay | Admitting: Family Medicine

## 2021-08-07 ENCOUNTER — Ambulatory Visit (INDEPENDENT_AMBULATORY_CARE_PROVIDER_SITE_OTHER): Payer: Medicare Other | Admitting: Family Medicine

## 2021-08-07 ENCOUNTER — Encounter: Payer: Self-pay | Admitting: Family Medicine

## 2021-08-07 ENCOUNTER — Telehealth: Payer: Self-pay | Admitting: Family Medicine

## 2021-08-07 VITALS — BP 122/76 | HR 83 | Temp 98.0°F | Ht 65.0 in | Wt 267.8 lb

## 2021-08-07 DIAGNOSIS — G8929 Other chronic pain: Secondary | ICD-10-CM

## 2021-08-07 DIAGNOSIS — M545 Low back pain, unspecified: Secondary | ICD-10-CM

## 2021-08-07 DIAGNOSIS — E785 Hyperlipidemia, unspecified: Secondary | ICD-10-CM | POA: Diagnosis not present

## 2021-08-07 DIAGNOSIS — E1169 Type 2 diabetes mellitus with other specified complication: Secondary | ICD-10-CM

## 2021-08-07 DIAGNOSIS — Z23 Encounter for immunization: Secondary | ICD-10-CM | POA: Diagnosis not present

## 2021-08-07 DIAGNOSIS — E119 Type 2 diabetes mellitus without complications: Secondary | ICD-10-CM | POA: Diagnosis not present

## 2021-08-07 DIAGNOSIS — I10 Essential (primary) hypertension: Secondary | ICD-10-CM

## 2021-08-07 LAB — COMPREHENSIVE METABOLIC PANEL
ALT: 32 U/L (ref 0–35)
AST: 22 U/L (ref 0–37)
Albumin: 4 g/dL (ref 3.5–5.2)
Alkaline Phosphatase: 56 U/L (ref 39–117)
BUN: 12 mg/dL (ref 6–23)
CO2: 29 mEq/L (ref 19–32)
Calcium: 9.8 mg/dL (ref 8.4–10.5)
Chloride: 95 mEq/L — ABNORMAL LOW (ref 96–112)
Creatinine, Ser: 0.65 mg/dL (ref 0.40–1.20)
GFR: 90.52 mL/min (ref >60.00)
Glucose, Bld: 150 mg/dL — ABNORMAL HIGH (ref 70–99)
Potassium: 3.9 mEq/L (ref 3.5–5.1)
Sodium: 132 mEq/L — ABNORMAL LOW (ref 135–145)
Total Bilirubin: 0.4 mg/dL (ref 0.2–1.2)
Total Protein: 6.7 g/dL (ref 6.0–8.3)

## 2021-08-07 LAB — TSH: TSH: 2.41 u[IU]/mL (ref 0.35–5.50)

## 2021-08-07 LAB — HEMOGLOBIN A1C: Hgb A1c MFr Bld: 8 % — ABNORMAL HIGH (ref 4.6–6.5)

## 2021-08-07 NOTE — Addendum Note (Signed)
Addended by: Clyde Lundborg A on: 08/07/2021 11:30 AM ? ? Modules accepted: Orders ? ?

## 2021-08-07 NOTE — Patient Instructions (Addendum)
Let us know when you get the Shingrix vaccine. ? ?Prevnar 20 today- as of current guidelines should be final pneumonia shot ? ?Have your eye exam faxed to [place] when you have It done next month to [phone]. ? ?Please stop by lab before you go ?If you have mychart- we will send your results within 3 business days of [place] receiving them.  ?If you do not have mychart- we will call you about results within 5 business days of [place] receiving them.  ?*please also note that you will see labs on mychart as soon as they post. I will later go in and write notes on them- will say "notes from Dr. Yong Channel"  ? ?Recommended follow up: Return in about 6 months (around 02/07/2022) for physical or sooner if needed.Schedule b4 you leave.  ?

## 2021-08-07 NOTE — Telephone Encounter (Signed)
.. ?  Encourage patient to contact the pharmacy for refills or they can request refills through [facility] ? ?LAST APPOINTMENT DATE:  08/07/21 ? ?NEXT APPOINTMENT DATE: 02/04/22 ? ?MEDICATION:metFORMIN (GLUCOPHAGE) 1000 MG tablet ? ?Is the patient out of medication? ? ?PHARMACY: ?[facility] ([facility] ) - [place], [place] Phone:  [phone]  ?Fax:  [phone]  ?  ? ? ?Let patient know to contact pharmacy at the end of the day to make sure medication is ready. ? ?Please notify patient to allow 48-72 hours to process  ?

## 2021-08-08 MED ORDER — METFORMIN HCL 1000 MG PO TABS: 1000.0000 mg | ORAL_TABLET | Freq: Two times a day (BID) | ORAL | 3 refills | Status: DC

## 2021-08-08 NOTE — Telephone Encounter (Signed)
Rx has been sent to pharmacy

## 2021-08-27 ENCOUNTER — Other Ambulatory Visit: Payer: Self-pay | Admitting: Family Medicine

## 2021-09-05 DIAGNOSIS — E119 Type 2 diabetes mellitus without complications: Secondary | ICD-10-CM | POA: Diagnosis not present

## 2021-09-05 DIAGNOSIS — H40053 Ocular hypertension, bilateral: Secondary | ICD-10-CM | POA: Diagnosis not present

## 2021-09-25 ENCOUNTER — Other Ambulatory Visit: Payer: Self-pay | Admitting: Family Medicine

## 2021-10-02 ENCOUNTER — Telehealth: Payer: Self-pay | Admitting: Family Medicine

## 2021-10-02 NOTE — Telephone Encounter (Signed)
Declined AWV per spouse

## 2021-10-27 ENCOUNTER — Other Ambulatory Visit: Payer: Self-pay | Admitting: Family Medicine

## 2021-11-24 ENCOUNTER — Other Ambulatory Visit: Payer: Self-pay | Admitting: Family Medicine

## 2021-12-21 ENCOUNTER — Other Ambulatory Visit: Payer: Self-pay | Admitting: Family Medicine

## 2022-01-16 ENCOUNTER — Other Ambulatory Visit: Payer: Self-pay | Admitting: Family Medicine

## 2022-02-03 ENCOUNTER — Encounter: Payer: Self-pay | Admitting: *Deleted

## 2022-02-04 ENCOUNTER — Ambulatory Visit (INDEPENDENT_AMBULATORY_CARE_PROVIDER_SITE_OTHER): Payer: Medicare Other | Admitting: Family Medicine

## 2022-02-04 ENCOUNTER — Encounter: Payer: Self-pay | Admitting: Family Medicine

## 2022-02-04 VITALS — BP 130/78 | HR 85 | Temp 97.4°F | Ht 65.0 in | Wt 252.4 lb

## 2022-02-04 DIAGNOSIS — Z23 Encounter for immunization: Secondary | ICD-10-CM

## 2022-02-04 DIAGNOSIS — E119 Type 2 diabetes mellitus without complications: Secondary | ICD-10-CM

## 2022-02-04 DIAGNOSIS — Z0000 Encounter for general adult medical examination without abnormal findings: Secondary | ICD-10-CM | POA: Diagnosis not present

## 2022-02-04 LAB — CBC WITH DIFFERENTIAL/PLATELET
Basophils Absolute: 0.1 10*3/uL (ref 0.0–0.1)
Basophils Relative: 0.7 % (ref 0.0–3.0)
Eosinophils Absolute: 0.2 10*3/uL (ref 0.0–0.7)
Eosinophils Relative: 1.8 % (ref 0.0–5.0)
HCT: 41 % (ref 36.0–46.0)
Hemoglobin: 13.7 g/dL (ref 12.0–15.0)
Lymphocytes Relative: 26.5 % (ref 12.0–46.0)
Lymphs Abs: 2.5 10*3/uL (ref 0.7–4.0)
MCHC: 33.5 g/dL (ref 30.0–36.0)
MCV: 88.1 fl (ref 78.0–100.0)
Monocytes Absolute: 0.7 10*3/uL (ref 0.1–1.0)
Monocytes Relative: 7.6 % (ref 3.0–12.0)
Neutro Abs: 6 10*3/uL (ref 1.4–7.7)
Neutrophils Relative %: 63.4 % (ref 43.0–77.0)
Platelets: 308 10*3/uL (ref 150.0–400.0)
RBC: 4.66 Mil/uL (ref 3.87–5.11)
RDW: 13.5 % (ref 11.5–15.5)
WBC: 9.4 10*3/uL (ref 4.0–10.5)

## 2022-02-04 LAB — COMPREHENSIVE METABOLIC PANEL
ALT: 24 U/L (ref 0–35)
AST: 19 U/L (ref 0–37)
Albumin: 4.1 g/dL (ref 3.5–5.2)
Alkaline Phosphatase: 56 U/L (ref 39–117)
BUN: 9 mg/dL (ref 6–23)
CO2: 28 mEq/L (ref 19–32)
Calcium: 9.8 mg/dL (ref 8.4–10.5)
Chloride: 95 mEq/L — ABNORMAL LOW (ref 96–112)
Creatinine, Ser: 0.63 mg/dL (ref 0.40–1.20)
GFR: 90.88 mL/min (ref >60.00)
Glucose, Bld: 107 mg/dL — ABNORMAL HIGH (ref 70–99)
Potassium: 3.8 mEq/L (ref 3.5–5.1)
Sodium: 131 mEq/L — ABNORMAL LOW (ref 135–145)
Total Bilirubin: 0.5 mg/dL (ref 0.2–1.2)
Total Protein: 7.1 g/dL (ref 6.0–8.3)

## 2022-02-04 LAB — HEMOGLOBIN A1C: Hgb A1c MFr Bld: 6.5 % (ref 4.6–6.5)

## 2022-02-04 LAB — MICROALBUMIN / CREATININE URINE RATIO
Creatinine,U: 47.5 mg/dL
Microalb Creat Ratio: 1.5 mg/g (ref 0.0–30.0)
Microalb, Ur: <0.7 mg/dL (ref 0.0–1.9)

## 2022-02-04 LAB — LIPID PANEL
Cholesterol: 158 mg/dL (ref 0–200)
HDL: 53.7 mg/dL (ref >39.00)
LDL Cholesterol: 79 mg/dL (ref 0–99)
NonHDL: 104.27
Total CHOL/HDL Ratio: 3
Triglycerides: 128 mg/dL (ref 0.0–149.0)
VLDL: 25.6 mg/dL (ref 0.0–40.0)

## 2022-02-04 MED ORDER — FLUCONAZOLE 150 MG PO TABS: 150.0000 mg | ORAL_TABLET | Freq: Every day | ORAL | 0 refills | Status: DC | PRN

## 2022-02-04 NOTE — Progress Notes (Signed)
Phone [phone]   Subjective:  Patient presents today for their annual physical. Chief complaint-noted.   See problem oriented charting- ROS- full  review of systems was completed and negative Per full ROS sheet completed by patient  The following were reviewed and entered/updated in epic: Past Medical History:  Diagnosis Date   Arthritis    Cancer ([place]) 2015   BREAST   Diabetes mellitus without complication ([place])    Frequent headaches    long term issues. saw neurology in the past. rarely takes rx- excedrin if bad   Hyperlipidemia    Hypertension    Patient Active Problem List   Diagnosis Date Noted   History of breast cancer 12/13/2014    Priority: High   Morbid obesity ([place]) 08/15/2014    Priority: High   Diabetes ([place]) 03/30/2012    Priority: High   Frequent headaches     Priority: Medium    Essential hypertension 03/30/2012    Priority: Medium    Hyperlipidemia associated with type 2 diabetes mellitus ([place]) 03/30/2012    Priority: Medium    Low back pain 12/26/2016    Priority: Low   Past Surgical History:  Procedure Laterality Date   BREAST SURGERY     lumpectomy bilateral, biopsy precceeded   CESAREAN SECTION     x2   ELBOW BURSA SURGERY Right    FOOT SURGERY     bone spurs    hernia groin left     as child   NASAL POLYP EXCISION     TOTAL KNEE ARTHROPLASTY Bilateral 2008    Family History  Problem Relation Age of Onset   Hyperlipidemia Mother        18 in 2018   Atrial fibrillation Mother    Hypertension Father    Parkinson's disease Father    Healthy Sister    Hypertension Brother        overweight   Prostate cancer Brother    Breast cancer Maternal Grandmother        mets- led to death. great grandmother had stroke young.    Heart disease Paternal Grandmother        pacemaker   Diabetes Maternal Uncle    Hypothyroidism Sister        after removal    Medications- reviewed and updated Current Outpatient Medications  Medication  Sig Dispense Refill   amLODipine (NORVASC) 5 MG tablet TAKE 1 TABLET BY MOUTH  DAILY 90 tablet 3   Flaxseed, Linseed, (FLAX SEEDS PO) Take by mouth.     fluconazole (DIFLUCAN) 150 MG tablet Take 1 tablet (150 mg total) by mouth daily as needed (as needed for intermittent yeast infections (one dose per treatment ideally)). 4 tablet 0   fluticasone (FLONASE) 50 MCG/ACT nasal spray USE 2 SPRAYS INTO EACH NOSTRIL EVERY DAY 48 g 2   hydrochlorothiazide (HYDRODIURIL) 25 MG tablet TAKE 1 TABLET BY MOUTH  DAILY 90 tablet 3   losartan (COZAAR) 100 MG tablet TAKE 1 TABLET BY MOUTH  DAILY 90 tablet 3   melatonin 5 MG TABS Take 1 tablet by mouth at bedtime.     metFORMIN (GLUCOPHAGE) 1000 MG tablet Take 1 tablet (1,000 mg total) by mouth 2 (two) times daily with a meal. 180 tablet 3   methocarbamol (ROBAXIN) 500 MG tablet TAKE 1 TABLET BY MOUTH 3 TIMES  DAILY 90 tablet 0   Omega-3 Fatty Acids (FISH OIL PO) Take 2,000 mg by mouth.     pravastatin (PRAVACHOL) 40 MG  tablet TAKE 1 TABLET BY MOUTH  DAILY 90 tablet 3   VITAMIN D PO Take 1 tablet by mouth daily.     No current facility-administered medications for this visit.    Allergies-reviewed and updated Allergies  Allergen Reactions   Ace Inhibitors     Patient developed a cough with use of ACE inhibitors.   Sulfa Antibiotics     Social History   Social History Narrative   Married 1975. 2 sons. 4 grandkids- all local (21 to 10 in 2018). Great grandchild in 2022!       Pianist in church.    2 years of college.       Hobbies: enjoys watching football (since radiation)- enjoys NFL, music, movies, pool in back yard, grandkids   Objective  Objective:  BP 130/78   Pulse 85   Temp (!) 97.4 F (36.3 C)   Ht '5\' 5"'$  (1.651 m)   Wt 252 lb 6.4 oz (114.5 kg)   SpO2 98%   BMI 42.00 kg/m  Gen: NAD, resting comfortably HEENT: Mucous membranes are moist. Oropharynx normal Neck: no thyromegaly CV: RRR no murmurs rubs or gallops Lungs: CTAB no  crackles, wheeze, rhonchi Abdomen: soft/nontender/nondistended/normal bowel sounds. No rebound or guarding.  Ext: trace edema Skin: warm, dry Neuro: grossly normal, moves all extremities, PERRLA  Diabetic Foot Exam - Simple   Simple Foot Form Diabetic Foot exam was performed with the following findings: Yes 02/04/2022 11:08 AM  Visual Inspection No deformities, no ulcerations, no other skin breakdown bilaterally: Yes Sensation Testing Intact to touch and monofilament testing bilaterally: Yes Pulse Check Posterior Tibialis and Dorsalis pulse intact bilaterally: Yes Comments       Assessment and Plan   71 y.o. female presenting for annual physical.  Health Maintenance counseling: 1. Anticipatory guidance: Patient counseled regarding regular dental exams -q6 months, eye exams - yearly,  avoiding smoking and second hand smoke , limiting alcohol to 1 beverage per day- no drinking , no illicit drugs .   2. Risk factor reduction:  Advised patient of need for regular exercise and diet rich and fruits and vegetables to reduce risk of heart attack and stroke.  Exercise- encouraged to increase exercise- minimal- goal 150 minutes a week.  Diet/weight management-down 15 lbs- supported husband after surgery with clears several days and softs as portion of this- trying to eat reasonably healthy. Down from peak from 304.6 (down to weight form prior knee surgery and feels happy)- down 60 lbs from her peak weight.  Wt Readings from Last 3 Encounters:  02/04/22 252 lb 6.4 oz (114.5 kg)  08/07/21 267 lb 12.8 oz (121.5 kg)  02/04/21 259 lb 6.4 oz (117.7 kg)  3. Immunizations/screenings/ancillary studies- shingrix- wants to hold off, flu shot today, updated covid shot discussed- considering Immunization History  Administered Date(s) Administered   Fluad Quad(high Dose 65+) 02/02/2020, 02/04/2021, 02/04/2022   Influenza Split 03/30/2012   Influenza, Quadrivalent, Recombinant, Inj, Pf 03/26/2017    Influenza,inj,Quad PF,6+ Mos 01/11/2013, 12/20/2013, 03/04/2016, 01/28/2018   Influenza-Unspecified 01/11/2015, 02/01/2019   PFIZER(Purple Top)SARS-COV-2 Vaccination 07/02/2019, 07/26/2019, 02/28/2020   PNEUMOCOCCAL CONJUGATE-20 08/07/2021   Pneumococcal Conjugate-13 08/15/2014   Pneumococcal Polysaccharide-23 06/23/2016   Tdap 08/26/2011  4. Cervical cancer screening- past age based screening recommendations. No longer with GYN 5. Breast cancer screening-patient with breast cancer history-synchronous bilateral treated with lumpectomy and radiation and continues follow-up with oncology at [facility] and prior on letrozole- now off.  Receives mammograms yearly-most recently dated 03/07/21- team will  abstract - will update later this year 6. Colon cancer screening - 04/08/21 cologuard- repeat in 3 years  7. Skin cancer screening-does not see dermatology. advised regular sunscreen use. Denies worrisome, changing, or new skin lesions.  8. Birth control/STD check-monogamous/postmenopausal 9. Osteoporosis screening at 18- DEXA scan completed 09/30/2017 noted as normal-consider repeat 2024  -Never smoker  Status of chronic or acute concerns   # Diabetes S: Compliant with Metformin 1000 mg twice a day Lab Results  Component Value Date   HGBA1C 8.0 (H) 08/07/2021   A/P: elevated last visit visit- updated today- avoid jardiance with yeast history. Also with sisters thyroid history- she is more hesitant about ozempic - sparing yeast infections. Creams were not very helpful. Given GYN in past- asks for refill. Given 3 tablets actually 07/14/2016 per rx -had eye exam and encouraging- we will try to get results   #hypertension S: compliant with hydrochlorothiazide 25 mg, losartan 100 mg, amlodipine 5 mg  BP Readings from Last 3 Encounters:  02/04/22 130/78  08/07/21 122/76  02/04/21 (!) 144/90  A/P: Controlled. Continue current medications.  -Hyponatremia at times-HCTZ likely contributes    #hyperlipidemia S: compliant with pravastatin 40 mg  Lab Results  Component Value Date   CHOL 183 02/04/2021   HDL 59.00 02/04/2021   LDLCALC 87 02/04/2021   LDLDIRECT 100.0 07/29/2018   TRIG 181.0 (H) 02/04/2021   CHOLHDL 3 02/04/2021   A/P: reasonable control on last check- update lipids  #Low back pain S: Patient with history of degenerative disc disease based on MRI of lumbar spine in the past.  Intermittent flares at times and uses methocarbamol A/P: overall stable- continue current meds   # headaches frequent- very sparing excedrin to avoid rebound headaches - Thinks barometric pressure is a trigger. Overall stable  Recommended follow up: Return in about 4 months (around 06/06/2022) for followup or sooner if needed.Schedule b4 you leave.  Lab/Order associations: fasting   ICD-10-CM   1. Preventative health care  Z00.00     2. Type 2 diabetes mellitus without complication, without long-term current use of insulin (HCC)  E11.9 Microalbumin / creatinine urine ratio    CBC with Differential/Platelet    Comprehensive metabolic panel    Lipid panel    Hemoglobin A1c    3. Need for immunization against influenza  Z23 Flu Vaccine QUAD High Dose(Fluad)      Meds ordered this encounter  Medications   fluconazole (DIFLUCAN) 150 MG tablet    Sig: Take 1 tablet (150 mg total) by mouth daily as needed (as needed for intermittent yeast infections (one dose per treatment ideally)).    Dispense:  4 tablet    Refill:  0    Return precautions advised.  Garret Reddish, MD

## 2022-02-04 NOTE — Patient Instructions (Addendum)
Sign release of information at the check out desk for Diabetic eye exam.  Please stop by lab before you go If you have mychart- we will send your results within 3 business days of [place] receiving them.  If you do not have mychart- we will call you about results within 5 business days of [place] receiving them.  *please also note that you will see labs on mychart as soon as they post. I will later go in and write notes on them- will say "notes from Dr. Yong Channel"   Recommended follow up: Return in about 4 months (around 06/06/2022) for followup or sooner if needed.Schedule b4 you leave.  OR 6 months if a1c is under 7

## 2022-02-05 ENCOUNTER — Other Ambulatory Visit: Payer: Self-pay | Admitting: Family Medicine

## 2022-02-05 DIAGNOSIS — I10 Essential (primary) hypertension: Secondary | ICD-10-CM

## 2022-02-05 DIAGNOSIS — E785 Hyperlipidemia, unspecified: Secondary | ICD-10-CM

## 2022-02-13 ENCOUNTER — Other Ambulatory Visit: Payer: Self-pay | Admitting: Family Medicine

## 2022-02-19 ENCOUNTER — Other Ambulatory Visit: Payer: Self-pay | Admitting: Family Medicine

## 2022-02-19 MED ORDER — FLUCONAZOLE 100 MG PO TABS: 150.0000 mg | ORAL_TABLET | Freq: Every day | ORAL | 0 refills | Status: DC | PRN

## 2022-02-19 NOTE — Progress Notes (Signed)
Needed new prescription-pharmacy only has fluconazole 100 mg tablets available

## 2022-03-12 ENCOUNTER — Other Ambulatory Visit: Payer: Self-pay | Admitting: Family Medicine

## 2022-03-12 DIAGNOSIS — E119 Type 2 diabetes mellitus without complications: Secondary | ICD-10-CM

## 2022-04-09 ENCOUNTER — Other Ambulatory Visit: Payer: Self-pay | Admitting: Family Medicine

## 2022-05-06 ENCOUNTER — Other Ambulatory Visit: Payer: Self-pay | Admitting: Family Medicine

## 2022-05-10 ENCOUNTER — Emergency Department (HOSPITAL_COMMUNITY): Payer: Medicare Other

## 2022-05-10 ENCOUNTER — Other Ambulatory Visit: Payer: Self-pay

## 2022-05-10 ENCOUNTER — Emergency Department (HOSPITAL_COMMUNITY)
Admission: EM | Admit: 2022-05-10 | Discharge: 2022-05-11 | Disposition: A | Discharge status: Home / Self Care | Payer: Medicare Other | Attending: Emergency Medicine | Admitting: Emergency Medicine

## 2022-05-10 ENCOUNTER — Encounter (HOSPITAL_COMMUNITY): Payer: Self-pay | Admitting: Emergency Medicine

## 2022-05-10 DIAGNOSIS — Y9289 Other specified places as the place of occurrence of the external cause: Secondary | ICD-10-CM | POA: Insufficient documentation

## 2022-05-10 DIAGNOSIS — Z7952 Long term (current) use of systemic steroids: Secondary | ICD-10-CM | POA: Insufficient documentation

## 2022-05-10 DIAGNOSIS — Z23 Encounter for immunization: Secondary | ICD-10-CM | POA: Insufficient documentation

## 2022-05-10 DIAGNOSIS — W108XXA Fall (on) (from) other stairs and steps, initial encounter: Secondary | ICD-10-CM | POA: Diagnosis not present

## 2022-05-10 DIAGNOSIS — S4990XA Unspecified injury of shoulder and upper arm, unspecified arm, initial encounter: Secondary | ICD-10-CM | POA: Insufficient documentation

## 2022-05-10 DIAGNOSIS — Y998 Other external cause status: Secondary | ICD-10-CM | POA: Insufficient documentation

## 2022-05-10 DIAGNOSIS — Z79899 Other long term (current) drug therapy: Secondary | ICD-10-CM | POA: Insufficient documentation

## 2022-05-10 DIAGNOSIS — Z7984 Long term (current) use of oral hypoglycemic drugs: Secondary | ICD-10-CM | POA: Insufficient documentation

## 2022-05-10 DIAGNOSIS — Z853 Personal history of malignant neoplasm of breast: Secondary | ICD-10-CM | POA: Insufficient documentation

## 2022-05-10 DIAGNOSIS — M25562 Pain in left knee: Secondary | ICD-10-CM | POA: Diagnosis not present

## 2022-05-10 DIAGNOSIS — I10 Essential (primary) hypertension: Secondary | ICD-10-CM | POA: Insufficient documentation

## 2022-05-10 DIAGNOSIS — S0990XA Unspecified injury of head, initial encounter: Secondary | ICD-10-CM | POA: Diagnosis not present

## 2022-05-10 DIAGNOSIS — S80911A Unspecified superficial injury of right knee, initial encounter: Secondary | ICD-10-CM | POA: Insufficient documentation

## 2022-05-10 DIAGNOSIS — S80912A Unspecified superficial injury of left knee, initial encounter: Secondary | ICD-10-CM | POA: Insufficient documentation

## 2022-05-10 DIAGNOSIS — S42252A Displaced fracture of greater tuberosity of left humerus, initial encounter for closed fracture: Secondary | ICD-10-CM | POA: Diagnosis not present

## 2022-05-10 DIAGNOSIS — S42212A Unspecified displaced fracture of surgical neck of left humerus, initial encounter for closed fracture: Secondary | ICD-10-CM | POA: Insufficient documentation

## 2022-05-10 DIAGNOSIS — W19XXXA Unspecified fall, initial encounter: Secondary | ICD-10-CM

## 2022-05-10 DIAGNOSIS — M25561 Pain in right knee: Secondary | ICD-10-CM | POA: Diagnosis not present

## 2022-05-10 DIAGNOSIS — Y9301 Activity, walking, marching and hiking: Secondary | ICD-10-CM | POA: Insufficient documentation

## 2022-05-10 DIAGNOSIS — E119 Type 2 diabetes mellitus without complications: Secondary | ICD-10-CM | POA: Insufficient documentation

## 2022-05-10 MED ORDER — TETANUS-DIPHTH-ACELL PERTUSSIS 5-2.5-18.5 LF-MCG/0.5 IM SUSY
0.5000 mL | PREFILLED_SYRINGE | Freq: Once | INTRAMUSCULAR | Status: AC
  Administered 2022-05-11: 0.5 mL via INTRAMUSCULAR
  Filled 2022-05-10: qty 0.5

## 2022-05-10 NOTE — ED Provider Triage Note (Signed)
Emergency Medicine Provider Triage Evaluation Note  Tammy Harris , a 71 y.o. female  was evaluated in triage.  Pt complains of fall.  Patient tripped of the last stair, hit her head and has pain in multiple joints.  Tetanus was last done in April 2013.  She did not lose consciousness.  She has a large abrasion to her forehead.  She was ambulatory..  Review of Systems  Positive: fall Negative: loc  Physical Exam  BP (!) 128/116 (BP Location: Right Arm)   Pulse 93   Temp 97.9 F (36.6 C) (Oral)   Resp 18   Ht '5\' 4"'$  (1.626 m)   Wt 115.7 kg   SpO2 97%   BMI 43.77 kg/m  Gen:   Awake, no distress   Resp:  Normal effort  MSK:   Moves extremities without difficulty  Other:  Abrasion R forehead   Medical Decision Making  Medically screening exam initiated at 10:43 PM.  Appropriate orders placed.  Tammy Harris was informed that the remainder of the evaluation will be completed by another provider, this initial triage assessment does not replace that evaluation, and the importance of remaining in the ED until their evaluation is complete.     Margarita Mail, PA-C 05/10/22 2245

## 2022-05-10 NOTE — ED Triage Notes (Signed)
Patient fell going up steps. She fell on her knees and hit her head on the side of the door. She also complains of pain and decreased range of motion in the left shoulder. She does not take any blood thinners.     HX: bilateral knee replacement

## 2022-05-11 ENCOUNTER — Encounter (HOSPITAL_COMMUNITY): Payer: Self-pay | Admitting: Emergency Medicine

## 2022-05-11 MED ORDER — ACETAMINOPHEN 500 MG PO TABS
1000.0000 mg | ORAL_TABLET | Freq: Once | ORAL | Status: AC
  Administered 2022-05-11: 1000 mg via ORAL
  Filled 2022-05-11: qty 2

## 2022-05-11 MED ORDER — OXYCODONE HCL 5 MG PO TABS: 2.5000 mg | ORAL_TABLET | Freq: Four times a day (QID) | ORAL | 0 refills | Status: AC | PRN

## 2022-05-11 NOTE — ED Provider Notes (Signed)
[facility] DEPT Provider Note  CSN: [id number] Arrival date & time: 05/10/22 2159  Chief Complaint(s) Fall, Knee Pain, and Shoulder Pain  HPI Tammy Harris is a 71 y.o. female who presents to the emergency department after mechanical fall while walking up a flight of steps.  Patient fell onto both knees, hit her head and left shoulder.  Complaining mostly of left shoulder pain worse with movement and range of motion.  Pain is dull at baseline but sharp with movement.  No loss of consciousness.  Patient denies any headache.  She has chronic back pain but denies any worsening of her baseline back pain.  No lower extremity weakness.   The history is provided by the patient.    Past Medical History Past Medical History:  Diagnosis Date   Arthritis    Cancer ([place]) 2015   BREAST   Diabetes mellitus without complication ([place])    Frequent headaches    long term issues. saw neurology in the past. rarely takes rx- excedrin if bad   Hyperlipidemia    Hypertension    Patient Active Problem List   Diagnosis Date Noted   Low back pain 12/26/2016   Frequent headaches    History of breast cancer 12/13/2014   Morbid obesity ([place]) 08/15/2014   Essential hypertension 03/30/2012   Hyperlipidemia associated with type 2 diabetes mellitus ([place]) 03/30/2012   Diabetes ([place]) 03/30/2012   Home Medication(s) Prior to Admission medications   Medication Sig Start Date End Date Taking? Authorizing Provider  oxyCODONE (ROXICODONE) 5 MG immediate release tablet Take 0.5-1 tablets (2.5-5 mg total) by mouth every 6 (six) hours as needed for up to 5 days for severe pain. 05/11/22 05/16/22 Yes Calven Gilkes, Grayce Sessions, MD  amLODipine (NORVASC) 5 MG tablet TAKE 1 TABLET BY MOUTH DAILY 02/06/22   Marin Olp, MD  Flaxseed, Linseed, (FLAX SEEDS PO) Take by mouth.    [provider]  fluconazole (DIFLUCAN) 100 MG tablet Take 1.5 tablets (150 mg total) by mouth daily  as needed (as needed for intermittent yeast infections (one dose per treatment ideally)). 02/19/22   Marin Olp, MD  fluticasone [facility]) 50 MCG/ACT nasal spray USE 2 SPRAYS INTO EACH NOSTRIL EVERY DAY 08/04/14   [name], MD  hydrochlorothiazide (HYDRODIURIL) 25 MG tablet TAKE 1 TABLET BY MOUTH DAILY 02/06/22   Marin Olp, MD  losartan (COZAAR) 100 MG tablet TAKE 1 TABLET BY MOUTH DAILY 02/06/22   Marin Olp, MD  melatonin 5 MG TABS Take 1 tablet by mouth at bedtime.    [provider]  metFORMIN (GLUCOPHAGE) 1000 MG tablet TAKE 1 TABLET BY MOUTH TWICE  DAILY WITH MEALS 03/12/22   Marin Olp, MD  methocarbamol (ROBAXIN) 500 MG tablet TAKE 1 TABLET BY MOUTH 3 TIMES  DAILY 05/07/22   Marin Olp, MD  Omega-3 Fatty Acids (FISH OIL PO) Take 2,000 mg by mouth.    [provider]  pravastatin (PRAVACHOL) 40 MG tablet TAKE 1 TABLET BY MOUTH DAILY 02/06/22   Marin Olp, MD  VITAMIN D PO Take 1 tablet by mouth daily.    [provider]  Allergies Ace inhibitors and Sulfa antibiotics  Review of Systems Review of Systems As noted in HPI  Physical Exam Vital Signs  I have reviewed the triage vital signs BP (!) 170/89   Pulse 85   Temp 97.9 F (36.6 C) (Oral)   Resp 18   Ht '5\' 4"'$  (1.626 m)   Wt 115.7 kg   SpO2 96%   BMI 43.77 kg/m   Physical Exam Constitutional:      General: She is not in acute distress.    Appearance: She is well-developed. She is not diaphoretic.  HENT:     Head: Normocephalic. Abrasion present.      Right Ear: External ear normal.     Left Ear: External ear normal.     Nose: Nose normal.  Eyes:     General: No scleral icterus.       Right eye: No discharge.        Left eye: No discharge.     Conjunctiva/sclera: Conjunctivae normal.     Pupils: Pupils are equal,  round, and reactive to light.  Cardiovascular:     Rate and Rhythm: Normal rate and regular rhythm.     Pulses:          Radial pulses are 2+ on the right side and 2+ on the left side.       Dorsalis pedis pulses are 2+ on the right side and 2+ on the left side.     Heart sounds: Normal heart sounds. No murmur heard.    No friction rub. No gallop.  Pulmonary:     Effort: Pulmonary effort is normal. No respiratory distress.     Breath sounds: Normal breath sounds. No stridor. No wheezing.  Abdominal:     General: There is no distension.     Palpations: Abdomen is soft.     Tenderness: There is no abdominal tenderness.  Musculoskeletal:     Right shoulder: Normal strength. Normal pulse.     Left shoulder: Tenderness and bony tenderness present. Decreased range of motion. Normal strength. Normal pulse.     Cervical back: Normal range of motion and neck supple. No bony tenderness.     Thoracic back: No bony tenderness.     Lumbar back: No bony tenderness.     Right knee: No bony tenderness.     Left knee: No bony tenderness.     Comments: Clavicles stable. Chest stable to AP/Lat compression. Pelvis stable to Lat compression. No obvious extremity deformity. No chest or abdominal wall contusion.  Skin:    General: Skin is warm and dry.     Findings: No erythema or rash.  Neurological:     Mental Status: She is alert and oriented to person, place, and time.     Comments: Moving all extremities     ED Results and Treatments Labs (all labs ordered are listed, but only abnormal results are displayed) Labs Reviewed - No data to display  EKG  EKG Interpretation  Date/Time:    Ventricular Rate:    PR Interval:    QRS Duration:   QT Interval:    QTC Calculation:   R Axis:     Text Interpretation:         Radiology CT Head Wo Contrast  Result Date:  05/10/2022 CLINICAL DATA:  Polytrauma, blunt. Pt fell onto her knees while walking up steps, hit head on side of door. EXAM: CT HEAD WITHOUT CONTRAST TECHNIQUE: Contiguous axial images were obtained from the base of the skull through the vertex without intravenous contrast. RADIATION DOSE REDUCTION: This exam was performed according to the departmental dose-optimization program which includes automated exposure control, adjustment of the mA and/or kV according to patient size and/or use of iterative reconstruction technique. COMPARISON:  None Available. FINDINGS: Brain: No evidence of large-territorial acute infarction. No parenchymal hemorrhage. No mass lesion. No extra-axial collection. No mass effect or midline shift. No hydrocephalus. Basilar cisterns are patent. Vascular: No hyperdense vessel. Skull: No acute fracture. Well-defined lytic lesion with a narrow of transition of the left skull. Sinuses/Orbits: Sinus surgical changes. Paranasal sinuses and mastoid air cells are clear. The orbits are unremarkable. Other: None. IMPRESSION: 1. No acute intracranial abnormality. 2. Benign appearing lytic lesion of the left skull likely represents an osseous hemangioma. Recommend correlation prior imaging to evaluate for stability. Differential diagnosis in a patient with history of breast cancer includes less likely metastasis. Electronically Signed   By: Iven Finn M.D.   On: 05/10/2022 23:17   DG Knee 1-2 Views Right  Result Date: 05/10/2022 CLINICAL DATA:  Fall, pain. EXAM: RIGHT KNEE - 1-2 VIEW COMPARISON:  None Available. FINDINGS: Status post right knee arthroplasty with intact hardware. No appreciable joint effusion. No periprosthetic fracture. IMPRESSION: Status post right knee arthroplasty with intact hardware. No periprosthetic fracture. Electronically Signed   By: Keane Police D.O.   On: 05/10/2022 23:00   DG Shoulder Left Port  Result Date: 05/10/2022 CLINICAL DATA:  Fall. EXAM: LEFT SHOULDER  COMPARISON:  None Available. FINDINGS: Mildly displaced impaction fracture of the humeral neck involving the greater tuberosity and humeral head. Moderate acromioclavicular osteoarthritis. Mildly subluxed glenohumeral joint without evidence of dislocation. IMPRESSION: Mildly displaced impaction fracture of the humeral neck involving the greater tuberosity and humeral head. Electronically Signed   By: Keane Police D.O.   On: 05/10/2022 23:00   DG Knee 1-2 Views Left  Result Date: 05/10/2022 CLINICAL DATA:  Pain EXAM: LEFT KNEE - 1-2 VIEW COMPARISON:  None Available. FINDINGS: Status post left knee arthroplasty with intact hardware. No acute fracture or appreciable joint effusion. Soft tissues are unremarkable. IMPRESSION: Status post left knee arthroplasty without evidence of hardware complication. Electronically Signed   By: Keane Police D.O.   On: 05/10/2022 22:56    Medications Ordered in ED Medications  Tdap (BOOSTRIX) injection 0.5 mL (0.5 mLs Intramuscular Given 05/11/22 0038)  acetaminophen (TYLENOL) tablet 1,000 mg (1,000 mg Oral Given 05/11/22 0120)  Procedures Procedures  (including critical care time)  Medical Decision Making / ED Course   Medical Decision Making Amount and/or Complexity of Data Reviewed Radiology: ordered and independent interpretation performed. Decision-making details documented in ED Course.  Risk OTC drugs.    Mechanical fall resulting in head injury, left shoulder and bilateral knee injuries. CT head negative for ICH. Plain film of the left shoulder notable for mildly displaced impacted humeral neck fracture.  Mild subluxation without overt dislocation. Plain films of bilateral knees without acute injuries or hardware instability  Patient declined any narcotic medication.  Opted for Tylenol. Provided with a sling.       Final Clinical Impression(s) / ED Diagnoses Final diagnoses:  Fall, initial encounter  Closed displaced fracture of surgical neck of left humerus, unspecified fracture morphology, initial encounter   The patient appears reasonably screened and/or stabilized for discharge and I doubt any other medical condition or other [facility] requiring further screening, evaluation, or treatment in the ED at this time. I have discussed the findings, Dx and Tx plan with the patient/family who expressed understanding and agree(s) with the plan. Discharge instructions discussed at length. The patient/family was given strict return precautions who verbalized understanding of the instructions. No further questions at time of discharge.  Disposition: Discharge  Condition: Good  ED Discharge Orders          Ordered    oxyCODONE (ROXICODONE) 5 MG immediate release tablet  Every 6 hours PRN        05/11/22 0146             Follow Up: Marin Olp, MD [place] [address] [phone]  Call  as needed  Gaynelle Arabian, [address] [place] [address] [phone]  Call  to schedule an appointment for close follow up           This chart was dictated using voice recognition software.  Despite best efforts to proofread,  errors can occur which can change the documentation meaning.    Fatima Blank, MD 05/11/22 [phone]

## 2022-05-11 NOTE — Discharge Instructions (Addendum)
For pain control you may take 1000 mg of Tylenol every 8 hours scheduled.  In addition you can take 0.5 to 1 tablet of Oxycodone every 6 hours as needed for pain not controlled with the scheduled Tylenol.  

## 2022-05-13 ENCOUNTER — Other Ambulatory Visit: Payer: Self-pay

## 2022-05-13 ENCOUNTER — Telehealth: Payer: Self-pay | Admitting: Family Medicine

## 2022-05-13 ENCOUNTER — Encounter: Payer: Self-pay | Admitting: Family Medicine

## 2022-05-13 DIAGNOSIS — S4292XA Fracture of left shoulder girdle, part unspecified, initial encounter for closed fracture: Secondary | ICD-10-CM

## 2022-05-13 NOTE — Telephone Encounter (Signed)
Below information has been forwarded to pt via mychart to their original [employer].

## 2022-05-13 NOTE — Telephone Encounter (Signed)
Patient states she broke her left shoulder on 05/10/22-is out of socket.  Patient requests to be called at ph# [phone] or [phone] to be given names of Surgeons for shoulder surgery. States urgent.  Declined office visit.  States talking to Pilar Plate Aluisio who did knee replacement

## 2022-05-13 NOTE — Telephone Encounter (Signed)
Not sure if I accidentally clicked out of mychart message but didn't see that one.   Here is who I would recommend Dr. Tamera Punt with [facility] Dr. Onnie Graham with [facility]- since already has relationship with Dr. Maureen Ralphs may make most sense to go here.   You can refer to either one for me as urgent under left shoulder dislocation and work with Lattie Haw

## 2022-05-13 NOTE — Telephone Encounter (Signed)
See below, ok to place without seeing you? Mychart message also sent to you from pt.

## 2022-05-15 ENCOUNTER — Ambulatory Visit
Admission: RE | Admit: 2022-05-15 | Discharge: 2022-05-15 | Disposition: A | Discharge status: Home / Self Care | Payer: Medicare Other | Source: Ambulatory Visit | Attending: Orthopedic Surgery | Admitting: Orthopedic Surgery

## 2022-05-15 ENCOUNTER — Other Ambulatory Visit: Payer: Self-pay | Admitting: Orthopedic Surgery

## 2022-05-15 ENCOUNTER — Other Ambulatory Visit: Payer: Self-pay

## 2022-05-15 DIAGNOSIS — M25512 Pain in left shoulder: Secondary | ICD-10-CM

## 2022-05-15 DIAGNOSIS — S4292XA Fracture of left shoulder girdle, part unspecified, initial encounter for closed fracture: Secondary | ICD-10-CM

## 2022-05-15 DIAGNOSIS — S42212A Unspecified displaced fracture of surgical neck of left humerus, initial encounter for closed fracture: Secondary | ICD-10-CM | POA: Diagnosis not present

## 2022-05-15 DIAGNOSIS — S42252A Displaced fracture of greater tuberosity of left humerus, initial encounter for closed fracture: Secondary | ICD-10-CM | POA: Diagnosis not present

## 2022-05-16 ENCOUNTER — Other Ambulatory Visit: Payer: Self-pay | Admitting: Orthopedic Surgery

## 2022-05-16 DIAGNOSIS — M25512 Pain in left shoulder: Secondary | ICD-10-CM | POA: Diagnosis not present

## 2022-05-20 NOTE — Progress Notes (Signed)
COVID Vaccine Completed:  Yes  Date of COVID positive in last 90 days:  PCP - Garret Reddish, MD Cardiologist -   Chest x-ray -  EKG -  Stress Test -  ECHO -  Cardiac Cath -  Pacemaker/ICD device last checked: Spinal Cord Stimulator:  Bowel Prep -   Sleep Study -  CPAP -   Fasting Blood Sugar -  Checks Blood Sugar _____ times a day  Last dose of GLP1 agonist-  N/A GLP1 instructions:  N/A   Last dose of SGLT-2 inhibitors-  N/A SGLT-2 instructions: N/A   Blood Thinner Instructions: Aspirin Instructions: Last Dose:  Activity level:  Can go up a flight of stairs and perform activities of daily living without stopping and without symptoms of chest pain or shortness of breath.  Able to exercise without symptoms  Unable to go up a flight of stairs without symptoms of     Anesthesia review:   Patient denies shortness of breath, fever, cough and chest pain at PAT appointment  Patient verbalized understanding of instructions that were given to them at the PAT appointment. Patient was also instructed that they will need to review over the PAT instructions again at home before surgery.

## 2022-05-20 NOTE — Patient Instructions (Signed)
SURGICAL WAITING ROOM VISITATION Patients having surgery or a procedure may have no more than 2 support people in the waiting area - these visitors may rotate.    If the patient needs to stay at the hospital during part of their recovery, the visitor guidelines for inpatient rooms apply. Pre-op nurse will coordinate an appropriate time for 1 support person to accompany patient in pre-op.  This support person may not rotate.    Please refer to the [facility] website for the visitor guidelines for Inpatients (after your surgery is over and you are in a regular room).   Due to an increase in RSV and influenza rates and associated hospitalizations, children ages 35 and under may not visit patients in [place].     Your procedure is scheduled on: 05-22-22   Report to [facility] Main Entrance    Report to admitting at 9:20 AM   Call this number if you have problems the morning of surgery [phone]   Do not eat food :After Midnight.   After Midnight you may have the following liquids until 8:50 AM DAY OF SURGERY  Water Non-Citrus Juices (without pulp, NO RED) Carbonated Beverages Black Coffee (NO MILK/CREAM OR CREAMERS, sugar ok)  Clear Tea (NO MILK/CREAM OR CREAMERS, sugar ok) regular and decaf                             Plain Jell-O (NO RED)                                           Fruit ices (not with fruit pulp, NO RED)                                     Popsicles (NO RED)                                                               Sports drinks like Gatorade (NO RED)                   The day of surgery:  Drink ONE (1) Pre-Surgery G2 at 8:50 AM the morning of surgery. Drink in one sitting. Do not sip.  This drink was given to you during your hospital  pre-op appointment visit. Nothing else to drink after completing the Pre-Surgery G2.          If you have questions, please contact your surgeon's office.   FOLLOW  ANY ADDITIONAL PRE OP  INSTRUCTIONS YOU RECEIVED FROM YOUR SURGEON'S OFFICE!!!     Oral Hygiene is also important to reduce your risk of infection.                                    Remember - BRUSH YOUR TEETH THE MORNING OF SURGERY WITH YOUR REGULAR TOOTHPASTE   Do NOT smoke after Midnight   Take these medicines the morning of surgery with A SIP OF WATER: Amlodipine Pravastatin Tylenol if  needed   How to Manage Your Diabetes Before and After Surgery  Why is it important to control my blood sugar before and after surgery? Improving blood sugar levels before and after surgery helps healing and can limit problems. A way of improving blood sugar control is eating a healthy diet by:  Eating less sugar and carbohydrates  Increasing activity/exercise  Talking with your doctor about reaching your blood sugar goals High blood sugars (greater than 180 mg/dL) can raise your risk of infections and slow your recovery, so you will need to focus on controlling your diabetes during the weeks before surgery. Make sure that the doctor who takes care of your diabetes knows about your planned surgery including the date and location.  How do I manage my blood sugar before surgery? Check your blood sugar at least 4 times a day, starting 2 days before surgery, to make sure that the level is not too high or low. Check your blood sugar the morning of your surgery when you wake up and every 2 hours until you get to the Short Stay unit. If your blood sugar is less than 70 mg/dL, you will need to treat for low blood sugar: Do not take insulin. Treat a low blood sugar (less than 70 mg/dL) with  cup of clear juice (cranberry or apple), 4 glucose tablets, OR glucose gel. Recheck blood sugar in 15 minutes after treatment (to make sure it is greater than 70 mg/dL). If your blood sugar is not greater than 70 mg/dL on recheck, call [phone] for further instructions. Report your blood sugar to the short stay nurse when you get to Short  Stay.  If you are admitted to the hospital after surgery: Your blood sugar will be checked by the staff and you will probably be given insulin after surgery (instead of oral diabetes medicines) to make sure you have good blood sugar levels. The goal for blood sugar control after surgery is 80-180 mg/dL.   WHAT DO I DO ABOUT MY DIABETES MEDICATION?  Do not take oral diabetes medicines (pills) the morning of surgery (do not take Metformin the morning of surgery)   Reviewed and Endorsed by [facility] Patient Education Committee, August 2015  Bring CPAP mask and tubing day of surgery.                              You may not have any metal on your body including hair pins, jewelry, and body piercing             Do not wear make-up, lotions, powders, perfumes or deodorant  Do not wear nail polish including gel and S&S, artificial/acrylic nails, or any other type of covering on natural nails including finger and toenails. If you have artificial nails, gel coating, etc. that needs to be removed by a nail salon please have this removed prior to surgery or surgery may need to be canceled/ delayed if the surgeon/ anesthesia feels like they are unable to be safely monitored.   Do not shave  48 hours prior to surgery.      Do not bring valuables to the hospital. [place].   Contacts, dentures or bridgework may not be worn into surgery.  DO NOT [place]. PHARMACY WILL DISPENSE MEDICATIONS LISTED ON YOUR MEDICATION LIST TO YOU DURING YOUR ADMISSION [place]!    Patients  discharged on the day of surgery will not be allowed to drive home.  Someone NEEDS to stay with you for the first 24 hours after anesthesia.   Special Instructions: Bring a copy of your healthcare power of attorney and living will documents the day of surgery if you haven't scanned them before.              Please read over the following fact sheets  you were given: IF [place]  If you received a COVID test during your pre-op visit  it is requested that you wear a mask when out in public, stay away from anyone that may not be feeling well and notify your surgeon if you develop symptoms. If you test positive for Covid or have been in contact with anyone that has tested positive in the last 10 days please notify you surgeon.  [place]- Preparing for Total Shoulder Arthroplasty    Before surgery, you can play an important role. Because skin is not sterile, your skin needs to be as free of germs as possible. You can reduce the number of germs on your skin by using the following products. Benzoyl Peroxide Gel Reduces the number of germs present on the skin Applied twice a day to shoulder area starting two days before surgery    ==================================================================  Please follow these instructions carefully:  BENZOYL PEROXIDE 5% GEL  Please do not use if you have an allergy to benzoyl peroxide.   If your skin becomes reddened/irritated stop using the benzoyl peroxide.  Starting two days before surgery, apply as follows: Apply benzoyl peroxide in the morning and at night. Apply after taking a shower. If you are not taking a shower clean entire shoulder front, back, and side along with the armpit with a clean wet washcloth.  Place a quarter-sized dollop on your shoulder and rub in thoroughly, making sure to cover the front, back, and side of your shoulder, along with the armpit.   2 days before ____ AM   ____ PM              1 day before ____ AM   ____ PM                         Do this twice a day for two days.  (Last application is the night before surgery, AFTER using the CHG soap as described below).  Do NOT apply benzoyl peroxide gel on the day of surgery.  [place] - Preparing for Surgery Before surgery, you can play an  important role.  Because skin is not sterile, your skin needs to be as free of germs as possible.  You can reduce the number of germs on your skin by washing with CHG (chlorahexidine gluconate) soap before surgery.  CHG is an antiseptic cleaner which kills germs and bonds with the skin to continue killing germs even after washing. Please DO NOT use if you have an allergy to CHG or antibacterial soaps.  If your skin becomes reddened/irritated stop using the CHG and inform your nurse when you arrive at Short Stay. Do not shave (including legs and underarms) for at least 48 hours prior to the first CHG shower.  You may shave your face/neck.  Please follow these instructions carefully:  1.  Shower with CHG Soap the night before surgery and the  morning of surgery.  2.  If you choose  to wash your hair, wash your hair first as usual with your normal  shampoo.  3.  After you shampoo, rinse your hair and body thoroughly to remove the shampoo.                             4.  Use CHG as you would any other liquid soap.  You can apply chg directly to the skin and wash.  Gently with a scrungie or clean washcloth.  5.  Apply the CHG Soap to your body ONLY FROM THE NECK DOWN.   Do   not use on face/ open                           Wound or open sores. Avoid contact with eyes, ears mouth and   genitals (private parts).                       Wash face,  Genitals (private parts) with your normal soap.             6.  Wash thoroughly, paying special attention to the area where your    surgery  will be performed.  7.  Thoroughly rinse your body with warm water from the neck down.  8.  DO NOT shower/wash with your normal soap after using and rinsing off the CHG Soap.                9.  Pat yourself dry with a clean towel.            10.  Wear clean pajamas.            11.  Place clean sheets on your bed the night of your first shower and do not  sleep with pets. Day of Surgery : Do not apply any lotions/deodorants the  morning of surgery.  Please wear clean clothes to the hospital/surgery center.  FAILURE TO FOLLOW THESE INSTRUCTIONS MAY RESULT IN THE CANCELLATION OF YOUR SURGERY  PATIENT SIGNATURE_________________________________  NURSE SIGNATURE__________________________________  ________________________________________________________________________    Adam Phenix  An incentive spirometer is a tool that can help keep your lungs clear and active. This tool measures how well you are filling your lungs with each breath. Taking long deep breaths may help reverse or decrease the chance of developing breathing (pulmonary) problems (especially infection) following: A long period of time when you are unable to move or be active. BEFORE THE PROCEDURE  If the spirometer includes an indicator to show your best effort, your nurse or respiratory therapist will set it to a desired goal. If possible, sit up straight or lean slightly forward. Try not to slouch. Hold the incentive spirometer in an upright position. INSTRUCTIONS FOR USE  Sit on the edge of your bed if possible, or sit up as far as you can in bed or on a chair. Hold the incentive spirometer in an upright position. Breathe out normally. Place the mouthpiece in your mouth and seal your lips tightly around it. Breathe in slowly and as deeply as possible, raising the piston or the ball toward the top of the column. Hold your breath for 3-5 seconds or for as long as possible. Allow the piston or ball to fall to the bottom of the column. Remove the mouthpiece from your mouth and breathe out normally. Rest for a few seconds and repeat Steps 1 through  7 at least 10 times every 1-2 hours when you are awake. Take your time and take a few normal breaths between deep breaths. The spirometer may include an indicator to show your best effort. Use the indicator as a goal to work toward during each repetition. After each set of 10 deep breaths, practice  coughing to be sure your lungs are clear. If you have an incision (the cut made at the time of surgery), support your incision when coughing by placing a pillow or rolled up towels firmly against it. Once you are able to get out of bed, walk around indoors and cough well. You may stop using the incentive spirometer when instructed by your caregiver.  RISKS AND COMPLICATIONS Take your time so you do not get dizzy or light-headed. If you are in pain, you may need to take or ask for pain medication before doing incentive spirometry. It is harder to take a deep breath if you are having pain. AFTER USE Rest and breathe slowly and easily. It can be helpful to keep track of a log of your progress. Your caregiver can provide you with a simple table to help with this. If you are using the spirometer at home, follow these instructions: [place] IF:  You are having difficultly using the spirometer. You have trouble using the spirometer as often as instructed. Your pain medication is not giving enough relief while using the spirometer. You develop fever of 100.5 F (38.1 C) or higher. SEEK IMMEDIATE MEDICAL CARE IF:  You cough up bloody sputum that had not been present before. You develop fever of 102 F (38.9 C) or greater. You develop worsening pain at or near the incision site. MAKE SURE YOU:  Understand these instructions. Will watch your condition. Will get help right away if you are not doing well or get worse. Document Released: 09/08/2006 Document Revised: 07/21/2011 Document Reviewed: 11/09/2006 [facility] Patient Information 2014 [place], [place].   ________________________________________________________________________

## 2022-05-21 ENCOUNTER — Encounter (HOSPITAL_COMMUNITY): Payer: Self-pay

## 2022-05-21 ENCOUNTER — Encounter (HOSPITAL_COMMUNITY)
Admission: RE | Admit: 2022-05-21 | Discharge: 2022-05-21 | Disposition: A | Discharge status: Home / Self Care | Payer: Medicare Other | Source: Ambulatory Visit | Attending: Orthopedic Surgery | Admitting: Orthopedic Surgery

## 2022-05-21 ENCOUNTER — Other Ambulatory Visit: Payer: Self-pay

## 2022-05-21 VITALS — BP 146/101 | HR 90 | Temp 98.2°F | Resp 16 | Ht 64.0 in | Wt 250.8 lb

## 2022-05-21 DIAGNOSIS — Z01818 Encounter for other preprocedural examination: Secondary | ICD-10-CM | POA: Diagnosis not present

## 2022-05-21 DIAGNOSIS — E119 Type 2 diabetes mellitus without complications: Secondary | ICD-10-CM | POA: Diagnosis not present

## 2022-05-21 DIAGNOSIS — I2510 Atherosclerotic heart disease of native coronary artery without angina pectoris: Secondary | ICD-10-CM

## 2022-05-21 HISTORY — DX: Family history of other specified conditions: Z84.89

## 2022-05-21 LAB — CBC
HCT: 41 % (ref 36.0–46.0)
Hemoglobin: 13.4 g/dL (ref 12.0–15.0)
MCH: 28.9 pg (ref 26.0–34.0)
MCHC: 32.7 g/dL (ref 30.0–36.0)
MCV: 88.6 fL (ref 80.0–100.0)
Platelets: 353 10*3/uL (ref 150–400)
RBC: 4.63 MIL/uL (ref 3.87–5.11)
RDW: 13.1 % (ref 11.5–15.5)
WBC: 6.6 10*3/uL (ref 4.0–10.5)
nRBC: 0 % (ref 0.0–0.2)

## 2022-05-21 LAB — BASIC METABOLIC PANEL
Anion gap: 12 (ref 5–15)
BUN: 16 mg/dL (ref 8–23)
CO2: 24 mmol/L (ref 22–32)
Calcium: 9.2 mg/dL (ref 8.9–10.3)
Chloride: 95 mmol/L — ABNORMAL LOW (ref 98–111)
Creatinine, Ser: 0.63 mg/dL (ref 0.44–1.00)
GFR, Estimated: >60 mL/min (ref >60)
Glucose, Bld: 129 mg/dL — ABNORMAL HIGH (ref 70–99)
Potassium: 3.8 mmol/L (ref 3.5–5.1)
Sodium: 131 mmol/L — ABNORMAL LOW (ref 135–145)

## 2022-05-21 LAB — GLUCOSE, CAPILLARY: Glucose-Capillary: 132 mg/dL — ABNORMAL HIGH (ref 70–99)

## 2022-05-21 LAB — HEMOGLOBIN A1C
Hgb A1c MFr Bld: 6.2 % — ABNORMAL HIGH (ref 4.8–5.6)
Mean Plasma Glucose: 131.24 mg/dL

## 2022-05-21 LAB — SURGICAL PCR SCREEN
MRSA, PCR: NEGATIVE
Staphylococcus aureus: NEGATIVE

## 2022-05-21 NOTE — Anesthesia Preprocedure Evaluation (Signed)
Anesthesia Evaluation  Patient identified by MRN, date of birth, ID band Patient awake    Reviewed: Allergy & Precautions, NPO status , Patient's Chart, lab work & pertinent test results  History of Anesthesia Complications (+) Family history of anesthesia reaction and history of anesthetic complications (son hard to wake up and wakes up during surgery)  Airway Mallampati: III  TM Distance: >3 FB Neck ROM: Full   Comment: Previous grade IIa view with Glidescope 3 Dental  (+) Dental Advisory Given   Pulmonary neg shortness of breath, neg sleep apnea, neg COPD, Recent URI , Resolved   Pulmonary exam normal breath sounds clear to auscultation       Cardiovascular hypertension (amlodipine, HCTZ, losartan), Pt. on medications (-) angina (-) Past MI, (-) Cardiac Stents and (-) CABG  Rhythm:Regular Rate:Normal  HLD   Neuro/Psych  Headaches  Neuromuscular disease (low back pain)    GI/Hepatic negative GI ROS, Neg liver ROS,,,  Endo/Other  diabetes (Hgb A1c 6.2), Well Controlled, Type 2, Oral Hypoglycemic Agents  Morbid obesity  Renal/GU negative Renal ROS     Musculoskeletal  (+) Arthritis ,    Abdominal  (+) + obese  Peds  Hematology negative hematology ROS (+)   Anesthesia Other Findings H/o breast cancer 2015  Reproductive/Obstetrics                             Anesthesia Physical Anesthesia Plan  ASA: 3  Anesthesia Plan: General   Post-op Pain Management: Tylenol PO (pre-op)* and Regional block*   Induction: Intravenous  PONV Risk Score and Plan: 2 and Ondansetron, Dexamethasone and Treatment may vary due to age or medical condition  Airway Management Planned: Oral ETT and Video Laryngoscope Planned  Additional Equipment:   Intra-op Plan:   Post-operative Plan: Extubation in OR  Informed Consent: I have reviewed the patients History and Physical, chart, labs and discussed the  procedure including the risks, benefits and alternatives for the proposed anesthesia with the patient or authorized representative who has indicated his/her understanding and acceptance.     Dental advisory given  Plan Discussed with: CRNA and Anesthesiologist  Anesthesia Plan Comments: (Risks of general anesthesia discussed including, but not limited to, sore throat, hoarse voice, chipped/damaged teeth, injury to vocal cords, nausea and vomiting, allergic reactions, lung infection, heart attack, stroke, and death. All questions answered.  Discussed potential risks of nerve blocks including, but not limited to, infection, bleeding, nerve damage, seizures, pneumothorax, respiratory depression, and potential failure of the block. Alternatives to nerve blocks discussed. All questions answered. )        Anesthesia Quick Evaluation

## 2022-05-22 ENCOUNTER — Encounter (HOSPITAL_COMMUNITY)
Admission: RE | Disposition: A | Discharge status: Home / Self Care | Payer: Self-pay | Source: Home / Self Care | Attending: Orthopedic Surgery

## 2022-05-22 ENCOUNTER — Ambulatory Visit (HOSPITAL_COMMUNITY): Payer: Medicare Other | Admitting: Anesthesiology

## 2022-05-22 ENCOUNTER — Ambulatory Visit (HOSPITAL_BASED_OUTPATIENT_CLINIC_OR_DEPARTMENT_OTHER): Payer: Medicare Other | Admitting: Anesthesiology

## 2022-05-22 ENCOUNTER — Encounter (HOSPITAL_COMMUNITY): Payer: Self-pay | Admitting: Orthopedic Surgery

## 2022-05-22 ENCOUNTER — Other Ambulatory Visit: Payer: Self-pay

## 2022-05-22 ENCOUNTER — Ambulatory Visit (HOSPITAL_COMMUNITY)
Admission: RE | Admit: 2022-05-22 | Discharge: 2022-05-22 | Disposition: A | Discharge status: Home / Self Care | Payer: Medicare Other | Attending: Orthopedic Surgery | Admitting: Orthopedic Surgery

## 2022-05-22 DIAGNOSIS — E119 Type 2 diabetes mellitus without complications: Secondary | ICD-10-CM | POA: Diagnosis not present

## 2022-05-22 DIAGNOSIS — Z853 Personal history of malignant neoplasm of breast: Secondary | ICD-10-CM | POA: Insufficient documentation

## 2022-05-22 DIAGNOSIS — W19XXXA Unspecified fall, initial encounter: Secondary | ICD-10-CM | POA: Diagnosis not present

## 2022-05-22 DIAGNOSIS — G8918 Other acute postprocedural pain: Secondary | ICD-10-CM | POA: Diagnosis not present

## 2022-05-22 DIAGNOSIS — I10 Essential (primary) hypertension: Secondary | ICD-10-CM

## 2022-05-22 DIAGNOSIS — Z79899 Other long term (current) drug therapy: Secondary | ICD-10-CM | POA: Diagnosis not present

## 2022-05-22 DIAGNOSIS — Z6841 Body Mass Index (BMI) 40.0 and over, adult: Secondary | ICD-10-CM | POA: Diagnosis not present

## 2022-05-22 DIAGNOSIS — E785 Hyperlipidemia, unspecified: Secondary | ICD-10-CM | POA: Diagnosis not present

## 2022-05-22 DIAGNOSIS — M25512 Pain in left shoulder: Secondary | ICD-10-CM | POA: Diagnosis not present

## 2022-05-22 DIAGNOSIS — S42292A Other displaced fracture of upper end of left humerus, initial encounter for closed fracture: Secondary | ICD-10-CM

## 2022-05-22 DIAGNOSIS — I2510 Atherosclerotic heart disease of native coronary artery without angina pectoris: Secondary | ICD-10-CM

## 2022-05-22 DIAGNOSIS — Z96612 Presence of left artificial shoulder joint: Secondary | ICD-10-CM | POA: Diagnosis not present

## 2022-05-22 DIAGNOSIS — Z01818 Encounter for other preprocedural examination: Secondary | ICD-10-CM

## 2022-05-22 DIAGNOSIS — Z7984 Long term (current) use of oral hypoglycemic drugs: Secondary | ICD-10-CM | POA: Insufficient documentation

## 2022-05-22 DIAGNOSIS — M1990 Unspecified osteoarthritis, unspecified site: Secondary | ICD-10-CM | POA: Diagnosis not present

## 2022-05-22 HISTORY — PX: REVERSE SHOULDER ARTHROPLASTY: SHX5054

## 2022-05-22 LAB — GLUCOSE, CAPILLARY
Glucose-Capillary: 157 mg/dL — ABNORMAL HIGH (ref 70–99)
Glucose-Capillary: 146 mg/dL — ABNORMAL HIGH (ref 70–99)

## 2022-05-22 SURGERY — ARTHROPLASTY, SHOULDER, TOTAL, REVERSE
Anesthesia: General | Site: Shoulder | Laterality: Left

## 2022-05-22 MED ORDER — LIDOCAINE 2% (20 MG/ML) 5 ML SYRINGE
INTRAMUSCULAR | Status: DC | PRN
  Administered 2022-05-22: 80 mg via INTRAVENOUS

## 2022-05-22 MED ORDER — WATER FOR IRRIGATION, STERILE IR SOLN
Status: DC | PRN
  Administered 2022-05-22: 2000 mL

## 2022-05-22 MED ORDER — PROMETHAZINE HCL 25 MG/ML IJ SOLN: 6.2500 mg | INTRAMUSCULAR | Status: DC | PRN

## 2022-05-22 MED ORDER — BUPIVACAINE LIPOSOME 1.3 % IJ SUSP
INTRAMUSCULAR | Status: DC | PRN
  Administered 2022-05-22: 10 mL via PERINEURAL

## 2022-05-22 MED ORDER — FENTANYL CITRATE (PF) 250 MCG/5ML IJ SOLN
INTRAMUSCULAR | Status: AC
  Filled 2022-05-22: qty 5

## 2022-05-22 MED ORDER — CEFAZOLIN SODIUM-DEXTROSE 2-4 GM/100ML-% IV SOLN
2.0000 g | INTRAVENOUS | Status: AC
  Administered 2022-05-22: 2 g via INTRAVENOUS
  Filled 2022-05-22: qty 100

## 2022-05-22 MED ORDER — PROPOFOL 10 MG/ML IV BOLUS
INTRAVENOUS | Status: DC | PRN
  Administered 2022-05-22: 180 mg via INTRAVENOUS

## 2022-05-22 MED ORDER — ACETAMINOPHEN 500 MG PO TABS
1000.0000 mg | ORAL_TABLET | Freq: Once | ORAL | Status: AC
  Administered 2022-05-22: 1000 mg via ORAL
  Filled 2022-05-22: qty 2

## 2022-05-22 MED ORDER — MIDAZOLAM HCL 2 MG/2ML IJ SOLN
1.0000 mg | Freq: Once | INTRAMUSCULAR | Status: AC
  Administered 2022-05-22: 2 mg via INTRAVENOUS
  Filled 2022-05-22: qty 2

## 2022-05-22 MED ORDER — OXYCODONE HCL 5 MG PO TABS: 5.0000 mg | ORAL_TABLET | ORAL | 0 refills | Status: DC | PRN

## 2022-05-22 MED ORDER — OXYCODONE HCL 5 MG PO TABS: 5.0000 mg | ORAL_TABLET | Freq: Once | ORAL | Status: DC | PRN

## 2022-05-22 MED ORDER — 0.9 % SODIUM CHLORIDE (POUR BTL) OPTIME
TOPICAL | Status: DC | PRN
  Administered 2022-05-22: 1000 mL

## 2022-05-22 MED ORDER — ONDANSETRON HCL 4 MG/2ML IJ SOLN
INTRAMUSCULAR | Status: DC | PRN
  Administered 2022-05-22: 4 mg via INTRAVENOUS

## 2022-05-22 MED ORDER — TRANEXAMIC ACID-NACL 1000-0.7 MG/100ML-% IV SOLN
1000.0000 mg | INTRAVENOUS | Status: AC
  Administered 2022-05-22: 1000 mg via INTRAVENOUS
  Filled 2022-05-22: qty 100

## 2022-05-22 MED ORDER — DEXAMETHASONE SODIUM PHOSPHATE 10 MG/ML IJ SOLN
INTRAMUSCULAR | Status: DC | PRN
  Administered 2022-05-22: 10 mg via INTRAVENOUS

## 2022-05-22 MED ORDER — ACETAMINOPHEN 325 MG PO TABS: 325.0000 mg | ORAL_TABLET | Freq: Four times a day (QID) | ORAL | Status: DC | PRN

## 2022-05-22 MED ORDER — FENTANYL CITRATE (PF) 100 MCG/2ML IJ SOLN
INTRAMUSCULAR | Status: DC | PRN
  Administered 2022-05-22: 50 ug via INTRAVENOUS

## 2022-05-22 MED ORDER — OXYCODONE HCL 5 MG/5ML PO SOLN: 5.0000 mg | Freq: Once | ORAL | Status: DC | PRN

## 2022-05-22 MED ORDER — ORAL CARE MOUTH RINSE: 15.0000 mL | Freq: Once | OROMUCOSAL | Status: AC

## 2022-05-22 MED ORDER — TRANEXAMIC ACID 1000 MG/10ML IV SOLN
2000.0000 mg | INTRAVENOUS | Status: DC
  Filled 2022-05-22: qty 20

## 2022-05-22 MED ORDER — BUPIVACAINE HCL (PF) 0.5 % IJ SOLN
INTRAMUSCULAR | Status: DC | PRN
  Administered 2022-05-22: 10 mL via PERINEURAL

## 2022-05-22 MED ORDER — FENTANYL CITRATE PF 50 MCG/ML IJ SOSY: 25.0000 ug | PREFILLED_SYRINGE | INTRAMUSCULAR | Status: DC | PRN

## 2022-05-22 MED ORDER — SODIUM CHLORIDE 0.9 % IR SOLN
Status: DC | PRN
  Administered 2022-05-22: 1000 mL

## 2022-05-22 MED ORDER — CHLORHEXIDINE GLUCONATE 0.12 % MT SOLN
15.0000 mL | Freq: Once | OROMUCOSAL | Status: AC
  Administered 2022-05-22: 15 mL via OROMUCOSAL

## 2022-05-22 MED ORDER — PHENYLEPHRINE HCL-NACL 20-0.9 MG/250ML-% IV SOLN
INTRAVENOUS | Status: DC | PRN
  Administered 2022-05-22: 40 ug/min via INTRAVENOUS

## 2022-05-22 MED ORDER — FENTANYL CITRATE PF 50 MCG/ML IJ SOSY
50.0000 ug | PREFILLED_SYRINGE | Freq: Once | INTRAMUSCULAR | Status: DC
  Filled 2022-05-22: qty 2

## 2022-05-22 MED ORDER — SUGAMMADEX SODIUM 200 MG/2ML IV SOLN
INTRAVENOUS | Status: DC | PRN
  Administered 2022-05-22: 250 mg via INTRAVENOUS

## 2022-05-22 MED ORDER — LACTATED RINGERS IV SOLN: INTRAVENOUS | Status: DC

## 2022-05-22 MED ORDER — ROCURONIUM BROMIDE 10 MG/ML (PF) SYRINGE
PREFILLED_SYRINGE | INTRAVENOUS | Status: DC | PRN
  Administered 2022-05-22: 70 mg via INTRAVENOUS
  Administered 2022-05-22: 20 mg via INTRAVENOUS

## 2022-05-22 MED ORDER — PROPOFOL 10 MG/ML IV BOLUS
INTRAVENOUS | Status: AC
  Filled 2022-05-22: qty 20

## 2022-05-22 SURGICAL SUPPLY — 72 items
BAG COUNTER SPONGE SURGICOUNT (BAG) IMPLANT
BAG ZIPLOCK 12X15 (MISCELLANEOUS) ×1 IMPLANT
BASEPLATE P2 COATD GLND 6.5X30 (Shoulder) IMPLANT
BIT DRILL 1.6MX128 (BIT) IMPLANT
BIT DRILL 2.5 DIA 127 CALI (BIT) IMPLANT
BIT DRILL 4 DIA CALIBRATED (BIT) IMPLANT
BLADE SAW SGTL 73X25 THK (BLADE) ×1 IMPLANT
BOOTIES KNEE HIGH SLOAN (MISCELLANEOUS) ×2 IMPLANT
COOLER ICEMAN CLASSIC (MISCELLANEOUS) IMPLANT
COVER BACK TABLE 60X90IN (DRAPES) ×1 IMPLANT
COVER SURGICAL LIGHT HANDLE (MISCELLANEOUS) ×1 IMPLANT
DRAPE INCISE IOBAN 66X45 STRL (DRAPES) ×1 IMPLANT
DRAPE ORTHO SPLIT 77X108 STRL (DRAPES) ×2
DRAPE POUCH INSTRU U-SHP 10X18 (DRAPES) ×1 IMPLANT
DRAPE SHEET LG 3/4 BI-LAMINATE (DRAPES) ×1 IMPLANT
DRAPE SURG 17X11 SM STRL (DRAPES) ×1 IMPLANT
DRAPE SURG ORHT 6 SPLT 77X108 (DRAPES) ×2 IMPLANT
DRAPE TOP 10253 STERILE (DRAPES) ×1 IMPLANT
DRAPE U-SHAPE 47X51 STRL (DRAPES) ×1 IMPLANT
DRSG AQUACEL AG ADV 3.5X 6 (GAUZE/BANDAGES/DRESSINGS) ×1 IMPLANT
DURAPREP 26ML APPLICATOR (WOUND CARE) ×2 IMPLANT
ELECT BLADE TIP CTD 4 INCH (ELECTRODE) ×1 IMPLANT
ELECT REM PT RETURN 15FT ADLT (MISCELLANEOUS) ×1 IMPLANT
FACESHIELD WRAPAROUND (MASK) ×1 IMPLANT
FACESHIELD WRAPAROUND OR TEAM (MASK) ×1 IMPLANT
GLOVE BIO SURGEON STRL SZ7.5 (GLOVE) ×1 IMPLANT
GLOVE BIOGEL PI IND STRL 6.5 (GLOVE) ×1 IMPLANT
GLOVE BIOGEL PI IND STRL 8 (GLOVE) ×1 IMPLANT
GLOVE SURG SS PI 6.5 STRL IVOR (GLOVE) ×1 IMPLANT
GOWN STRL REUS W/ TWL LRG LVL3 (GOWN DISPOSABLE) ×1 IMPLANT
GOWN STRL REUS W/ TWL XL LVL3 (GOWN DISPOSABLE) ×1 IMPLANT
GOWN STRL REUS W/TWL LRG LVL3 (GOWN DISPOSABLE) ×1
GOWN STRL REUS W/TWL XL LVL3 (GOWN DISPOSABLE) ×1
HANDPIECE INTERPULSE COAX TIP (DISPOSABLE) ×1
HOOD PEEL AWAY T7 (MISCELLANEOUS) ×3 IMPLANT
INSERT SMALL SOCKET 32MM NEU (Insert) IMPLANT
KIT BASIN OR (CUSTOM PROCEDURE TRAY) ×1 IMPLANT
KIT TURNOVER KIT A (KITS) IMPLANT
MANIFOLD NEPTUNE II (INSTRUMENTS) ×1 IMPLANT
NDL TROCAR POINT SZ 2 1/2 (NEEDLE) IMPLANT
NEEDLE TROCAR POINT SZ 2 1/2 (NEEDLE) ×1 IMPLANT
NS IRRIG 1000ML POUR BTL (IV SOLUTION) ×1 IMPLANT
P2 COATDE GLNOID BSEPLT 6.5X30 (Shoulder) ×1 IMPLANT
PACK SHOULDER (CUSTOM PROCEDURE TRAY) ×1 IMPLANT
PAD COLD SHLDR WRAP-ON (PAD) IMPLANT
PROTECTOR NERVE ULNAR (MISCELLANEOUS) IMPLANT
RESTRAINT HEAD UNIVERSAL NS (MISCELLANEOUS) ×1 IMPLANT
RETRIEVER SUT HEWSON (MISCELLANEOUS) IMPLANT
SCREW BONE LOCKING RSP 5.0X14 (Screw) ×2 IMPLANT
SCREW BONE LOCKING RSP 5.0X30 (Screw) ×2 IMPLANT
SCREW BONE RSP LOCK 5X14 (Screw) IMPLANT
SCREW BONE RSP LOCK 5X30 (Screw) IMPLANT
SCREW RETAIN W/HEAD 4MM OFFSET (Shoulder) IMPLANT
SET HNDPC FAN SPRY TIP SCT (DISPOSABLE) ×1 IMPLANT
SLING ARM IMMOBILIZER LRG (SOFTGOODS) IMPLANT
SLING ARM IMMOBILIZER MED (SOFTGOODS) IMPLANT
STEM HUMERAL REVERSE S 10X108 (Stem) IMPLANT
STRIP CLOSURE SKIN 1/2X4 (GAUZE/BANDAGES/DRESSINGS) ×2 IMPLANT
SUCTION FRAZIER HANDLE 10FR (MISCELLANEOUS)
SUCTION TUBE FRAZIER 10FR DISP (MISCELLANEOUS) IMPLANT
SUPPORT WRAP ARM LG (MISCELLANEOUS) IMPLANT
SUT ETHIBOND 2 V 37 (SUTURE) IMPLANT
SUT FIBERWIRE #2 38 REV NDL BL (SUTURE)
SUT MNCRL AB 4-0 PS2 18 (SUTURE) ×1 IMPLANT
SUT VIC AB 2-0 CT1 27 (SUTURE) ×2
SUT VIC AB 2-0 CT1 TAPERPNT 27 (SUTURE) ×2 IMPLANT
SUTURE FIBERWR#2 38 REV NDL BL (SUTURE) IMPLANT
TAPE LABRALWHITE 1.5X36 (TAPE) IMPLANT
TAPE SUT LABRALTAP WHT/BLK (SUTURE) IMPLANT
TOWEL OR 17X26 10 PK STRL BLUE (TOWEL DISPOSABLE) ×1 IMPLANT
TOWEL OR NON WOVEN STRL DISP B (DISPOSABLE) ×1 IMPLANT
WATER STERILE IRR 1000ML POUR (IV SOLUTION) ×1 IMPLANT

## 2022-05-22 NOTE — Evaluation (Signed)
Occupational Therapy Evaluation Patient Details Name: Tammy Harris MRN: [id number] DOB: [birth year] Today's Date: 05/22/2022   History of Present Illness Patient fell on 12/30 and found to have left huneral neck fracture. Patient now s/p reverse shoulder arthroplasty.   Clinical Impression   Tammy Harris is a 72 year old woman s/p shoulder replacement without functional use of left non-dominant upper extremity secondary to effects of surgery and interscalene block and shoulder precautions. Therapist provided education and instruction to patient and spouse in regards to exercises, precautions, positioning, donning upper extremity clothing and bathing while maintaining shoulder precautions, ice and edema management and donning/doffing sling. Patient and spouse verbalized understanding and demonstrated as needed. Patient needed assistance to donn shirt, underwear, pants, socks and shoes and provided with instruction on compensatory strategies to perform ADLs. Patient to follow up with MD for further therapy needs.        Recommendations for follow up therapy are one component of a multi-disciplinary discharge planning process, led by the attending physician.  Recommendations may be updated based on patient status, additional functional criteria and insurance authorization.   Follow Up Recommendations  Follow physician's recommendations for discharge plan and follow up therapies     Assistance Recommended at Discharge Frequent or constant Supervision/Assistance  Patient can return home with the following A little help with walking and/or transfers;A lot of help with bathing/dressing/bathroom;Assistance with cooking/housework;Help with stairs or ramp for entrance    Functional Status Assessment  Patient has had a recent decline in their functional status and/or demonstrates limited ability to make significant improvements in function in a reasonable and predictable amount of time   Equipment Recommendations  None recommended by OT    Recommendations for Other Services       Precautions / Restrictions Precautions Precautions: Shoulder Type of Shoulder Precautions: No AROM, No PROM Shoulder Interventions: Shoulder sling/immobilizer;Off for dressing/bathing/exercises Precaution Booklet Issued: Yes (comment) Required Braces or Orthoses: Sling Restrictions Weight Bearing Restrictions: Yes LUE Weight Bearing: Non weight bearing      Mobility Bed Mobility                    Transfers                          Balance Overall balance assessment: Mild deficits observed, not formally tested                                         ADL either performed or assessed with clinical judgement   ADL Overall ADL's : Needs assistance/impaired Eating/Feeding: Set up   Grooming: Set up   Upper Body Bathing: Moderate assistance   Lower Body Bathing: Moderate assistance   Upper Body Dressing : Maximal assistance   Lower Body Dressing: Maximal assistance   Toilet Transfer: Minimal assistance   Toileting- Clothing Manipulation and Hygiene: Maximal assistance       Functional mobility during ADLs: Minimal assistance       Vision Baseline Vision/History: 1 Wears glasses       Perception     Praxis      Pertinent Vitals/Pain Pain Assessment Pain Assessment: No/denies pain     Hand Dominance     Extremity/Trunk Assessment Upper Extremity Assessment Upper Extremity Assessment: LUE deficits/detail LUE Deficits / Details: impaired motor control and sensation secondary to block   Lower Extremity  Assessment Lower Extremity Assessment: Overall WFL for tasks assessed   Cervical / Trunk Assessment Cervical / Trunk Assessment: Normal   Communication     Cognition Arousal/Alertness: Awake/alert Behavior During Therapy: WFL for tasks assessed/performed Overall Cognitive Status: Within Functional Limits for tasks  assessed                                       General Comments       Exercises     Shoulder Instructions Shoulder Instructions Donning/doffing shirt without moving shoulder: Caregiver independent with task Method for sponge bathing under operated UE: Caregiver independent with task Donning/doffing sling/immobilizer: Caregiver independent with task Correct positioning of sling/immobilizer: Caregiver independent with task ROM for elbow, wrist and digits of operated UE: Caregiver independent with task Sling wearing schedule (on at all times/off for ADL's): Caregiver independent with task Proper positioning of operated UE when showering: Caregiver independent with task Dressing change: Caregiver independent with task Positioning of UE while sleeping: Caregiver independent with task    Home Living Family/patient expects to be discharged to:: Private residence Living Arrangements: Spouse/significant other Available Help at Discharge: Family;Available 24 hours/day                                    Prior Functioning/Environment                          OT Problem List: Decreased strength;Decreased range of motion;Impaired UE functional use;Pain      OT Treatment/Interventions:      OT Goals(Current goals can be found in the care plan section) Acute Rehab OT Goals OT Goal Formulation: All assessment and education complete, DC therapy  OT Frequency:      Co-evaluation              AM-PAC OT "6 Clicks" Daily Activity     Outcome Measure Help from another person eating meals?: A Little Help from another person taking care of personal grooming?: A Little Help from another person toileting, which includes using toliet, bedpan, or urinal?: A Lot Help from another person bathing (including washing, rinsing, drying)?: A Lot Help from another person to put on and taking off regular upper body clothing?: A Lot Help from another person to  put on and taking off regular lower body clothing?: A Lot 6 Click Score: 14   End of Session Nurse Communication: Mobility status  Activity Tolerance: Patient tolerated treatment well Patient left:    OT Visit Diagnosis: Pain                Time: 1340-1403 OT Time Calculation (min): 23 min Charges:  OT General Charges $OT Visit: 1 Visit OT Evaluation $OT Eval Low Complexity: 1 Low  Gustavo Lah, OTR/L Acute Care Rehab Services  Office [phone]   Lenward Chancellor 05/22/2022, 2:25 PM

## 2022-05-22 NOTE — Anesthesia Procedure Notes (Addendum)
Anesthesia Regional Block: Interscalene brachial plexus block   Pre-Anesthetic Checklist: , timeout performed,  Correct Patient, Correct Site, Correct Laterality,  Correct Procedure, Correct Position, site marked,  Risks and benefits discussed,  Surgical consent,  Pre-op evaluation,  At surgeon's request and post-op pain management  Laterality: Left  Prep: chloraprep       Needles:  Injection technique: Single-shot  Needle Type: Echogenic Stimulator Needle     Needle Length: 9cm  Needle Gauge: 21     Additional Needles:   Procedures:,,,, ultrasound used (permanent image in chart),,    Narrative:  Start time: 05/22/2022 9:19 AM End time: 05/22/2022 9:22 AM Injection made incrementally with aspirations every 5 mL.  Performed by: Personally  Anesthesiologist: Nilda Simmer, MD  Additional Notes: Pre-block: Patient unable to do thumbs up or scissors motion with her fingers. She cannot abduct her arm. She denies numbness or tingling.  Discussed risks and benefits of nerve block including, but not limited to, prolonged and/or permanent nerve injury involving sensory and/or motor function. Monitors were applied and a time-out was performed. The nerve and associated structures were visualized under ultrasound guidance. After negative aspiration, local anesthetic was slowly injected around the nerve. There was no evidence of high pressure during the procedure. There were no paresthesias. VSS remained stable and the patient tolerated the procedure well.

## 2022-05-22 NOTE — Op Note (Signed)
Procedure(s): LEFT REVERSE SHOULDER ARTHROPLASTY Procedure Note  Tammy Harris female 72 y.o. 05/22/2022  Preoperative diagnosis: Left comminuted intra-articular proximal humerus fracture  Postoperative diagnosis: Same   Procedure(s) and Anesthesia Type:    * LEFT REVERSE SHOULDER ARTHROPLASTY - General   Indications:  72 y.o. female with left comminuted intra-articular proximal humerus fracture status post fall.  Indicated for reverse total shoulder arthroplasty to try and promote the best outcome with function and pain relief..     Surgeon: Rhae Hammock   Assistants: Sheryle Hail PA-C Tammy Harris was present and scrubbed throughout the procedure and was essential in positioning, retraction, exposure, and closure)  Anesthesia: General endotracheal anesthesia with preoperative interscalene block given by the attending anesthesiologist    Procedure Detail  LEFT REVERSE SHOULDER ARTHROPLASTY   Estimated Blood Loss:  200 mL         Drains: none  Blood Given: none          Specimens: none        Complications:  * No complications entered in OR log *         Disposition: PACU - hemodynamically stable.         Condition: stable      OPERATIVE FINDINGS:  A DJO Altivate pressfit reverse total shoulder arthroplasty was placed with a  size 10 stem, a 32-4 glenosphere, and a standard-mm poly insert. The base plate  fixation was excellent.  The tuberosities were repaired around the implant.   PROCEDURE: The patient was identified in the preoperative holding area  where I personally marked the operative site after verifying site, side,  and procedure with the patient. An interscalene block given by  the attending anesthesiologist in the holding area and the patient was taken back to the operating room where all extremities were  carefully padded in position after general anesthesia was induced. She  was placed in a beach-chair position and the operative upper  extremity was  prepped and draped in a standard sterile fashion. An approximately 10-  cm incision was made from the tip of the coracoid process to the center  point of the humerus at the level of the axilla. Dissection was carried  down through subcutaneous tissues to the level of the cephalic vein  which was taken laterally with the deltoid. The pectoralis major was  retracted medially. The subdeltoid space was developed and the lateral  edge of the conjoined tendon was identified. The undersurface of  conjoined tendon was palpated and the musculocutaneous nerve was not in  the field. Retractor was placed underneath the conjoined and second  retractor was placed lateral into the deltoid.  The biceps tendon was traced proximally to identify the rotator interval.   The tuberosities were mobilized with a Cobb elevator and a rondure.  Once adequately mobilized the greater tuberosity was prepared with 4 suture tapes with 2 superior and 2 inferior.  These were tagged for later repair.  The tuberosities were also controlled with Ethibond sutures in the lesser and greater tuberosity for control.  The glenoid was exposed with the arm in an  abducted extended position. The anterior and posterior labrum were  completely excised and the capsule was released circumferentially to  allow for exposure of the glenoid for preparation.  Any remaining glenoid cartilage was removed.  The 2.5 mm drill was  placed using the guide in 5-10 inferior angulation and the tap was then advanced in the same hole. Small and large reamers were then  used. The tap was then removed and the Metaglene was then screwed in with excellent purchase.  The peripheral guide was then used to drilled measured and filled peripheral locking screws. The size 32-4 glenosphere was then impacted on the [facility] taper and the central screw was placed.   The proximal humeral shaft was then again exposed and the diaphyseal reamers were used to size the  canal. The final broach was left in place in the proximal trial was placed. The joint was reduced and the tuberosities were brought around the implant to assess height and soft tissue tension.  The above implant was felt to be stable and with appropriate soft tissue tension.  Therefore, final humeral stem was placed press-fit.  The final polyethylene component was impacted.  The joint was reduced and again the soft tissue tension and length were felt to be appropriate.   The tuberosities were then repaired around the implant with 4 suture tapes and 2 FiberWire's including a vertical tension band which gave excellent reconstruction of the tuberosities. The joint was then copiously irrigated with pulse lavage and the wound was then closed.  Skin was closed with 2-0 Vicryl in a deep dermal layer and 4-0  Monocryl for skin closure. Steri-Strips were applied. Sterile  dressings were then applied as well as a sling. The patient was allowed  to awaken from general anesthesia, transferred to stretcher, and taken  to recovery room in stable condition.   POSTOPERATIVE PLAN: The patient will be observed in the recovery room and if her pain is well-controlled with the regional block and she is hemodynamically stable I think she can go home today with her husband.  If there is any concern we can keep her overnight for observation.

## 2022-05-22 NOTE — Anesthesia Postprocedure Evaluation (Signed)
Anesthesia Post Note  Patient: Tammy Harris  Procedure(s) Performed: LEFT REVERSE SHOULDER ARTHROPLASTY (Left: Shoulder)     Patient location during evaluation: PACU Anesthesia Type: General Level of consciousness: awake Pain management: pain level controlled Vital Signs Assessment: post-procedure vital signs reviewed and stable Respiratory status: spontaneous breathing, nonlabored ventilation and respiratory function stable Cardiovascular status: blood pressure returned to baseline and stable Postop Assessment: no apparent nausea or vomiting Anesthetic complications: no   No notable events documented.  Last Vitals:  Vitals:   05/22/22 1300 05/22/22 1412  BP: 134/78 114/70  Pulse: 87 98  Resp: 17 12  Temp:  (!) 36.3 C  SpO2: 90% 93%    Last Pain:  Vitals:   05/22/22 1412  TempSrc:   PainSc: 0-No pain                 Nilda Simmer

## 2022-05-22 NOTE — H&P (Signed)
Tammy Harris is an 72 y.o. female.   Chief Complaint: L shoulder pain after fall  HPI: s/p fall with L comminuted intraarticular proximal humerus fracture.  Past Medical History:  Diagnosis Date   Arthritis    Cancer ([place]) 2015   BREAST   Diabetes mellitus without complication ([place])    Family history of adverse reaction to anesthesia    Son hard to wake up and wakes up during surgery   Frequent headaches    long term issues. saw neurology in the past. rarely takes rx- excedrin if bad   Hyperlipidemia    Hypertension     Past Surgical History:  Procedure Laterality Date   BREAST SURGERY     lumpectomy bilateral, biopsy precceeded   CESAREAN SECTION     x2   ELBOW BURSA SURGERY Right    FOOT SURGERY     bone spurs    hernia groin left     as child   JOINT REPLACEMENT     NASAL POLYP EXCISION     TOTAL KNEE ARTHROPLASTY Bilateral 2008    Family History  Problem Relation Age of Onset   Hyperlipidemia Mother        15 in 2018   Atrial fibrillation Mother    Hypertension Father    Parkinson's disease Father    Healthy Sister    Hypertension Brother        overweight   Prostate cancer Brother    Breast cancer Maternal Grandmother        mets- led to death. great grandmother had stroke young.    Heart disease Paternal Grandmother        pacemaker   Diabetes Maternal Uncle    Hypothyroidism Sister        after removal   Social History:  reports that she has never smoked. She has never used smokeless tobacco. She reports that she does not drink alcohol and does not use drugs.  Allergies:  Allergies  Allergen Reactions   Ace Inhibitors Cough    Patient developed a cough with use of ACE inhibitors.   Latex     Unknown reaction    Sulfa Antibiotics Hives and Itching    Medications Prior to Admission  Medication Sig Dispense Refill   acetaminophen (TYLENOL) 500 MG tablet Take 1,000 mg by mouth every 8 (eight) hours as needed for moderate pain.      amLODipine (NORVASC) 5 MG tablet TAKE 1 TABLET BY MOUTH DAILY 100 tablet 2   Flaxseed, Linseed, (FLAX SEEDS PO) Take 1 capsule by mouth daily.     fluconazole (DIFLUCAN) 100 MG tablet Take 1.5 tablets (150 mg total) by mouth daily as needed (as needed for intermittent yeast infections (one dose per treatment ideally)). (Patient taking differently: Take 100 mg by mouth daily as needed (as needed for intermittent yeast infections (one dose per treatment ideally)).) 6 tablet 0   fluticasone (FLONASE) 50 MCG/ACT nasal spray USE 2 SPRAYS INTO EACH NOSTRIL EVERY DAY 48 g 2   hydrochlorothiazide (HYDRODIURIL) 25 MG tablet TAKE 1 TABLET BY MOUTH DAILY 100 tablet 2   losartan (COZAAR) 100 MG tablet TAKE 1 TABLET BY MOUTH DAILY 100 tablet 2   Melatonin 10 MG TABS Take 10 mg by mouth at bedtime as needed (sleep).     metFORMIN (GLUCOPHAGE) 1000 MG tablet TAKE 1 TABLET BY MOUTH TWICE  DAILY WITH MEALS 200 tablet 2   methocarbamol (ROBAXIN) 500 MG tablet TAKE 1 TABLET BY MOUTH  3 TIMES  DAILY 90 tablet 0   Multiple Vitamin (MULTIVITAMIN WITH MINERALS) TABS tablet Take 1 tablet by mouth daily.     naproxen sodium (ALEVE) 220 MG tablet Take 220 mg by mouth 2 (two) times daily as needed.     Omega-3 Fatty Acids (FISH OIL PO) Take 1 capsule by mouth daily.     oxyCODONE (OXY IR/ROXICODONE) 5 MG immediate release tablet Take 5 mg by mouth at bedtime.     pravastatin (PRAVACHOL) 40 MG tablet TAKE 1 TABLET BY MOUTH DAILY 100 tablet 2   VITAMIN D PO Take 1 tablet by mouth daily.      Results for orders placed or performed during the hospital encounter of 05/22/22 (from the past 48 hour(s))  Glucose, capillary     Status: Abnormal   Collection Time: 05/22/22  8:02 AM  Result Value Ref Range   Glucose-Capillary 157 (H) 70 - 99 mg/dL    Comment: Glucose reference range applies only to samples taken after fasting for at least 8 hours.   No results found.  Review of Systems  All other systems reviewed and are  negative.   Blood pressure (!) 159/118, pulse 94, temperature 97.9 F (36.6 C), temperature source Oral, resp. rate 15, weight 113.8 kg, SpO2 95 %. Physical Exam HENT:     Head: Atraumatic.  Eyes:     Extraocular Movements: Extraocular movements intact.  Cardiovascular:     Pulses: Normal pulses.  Pulmonary:     Effort: Pulmonary effort is normal.  Musculoskeletal:     Comments: L shoulder swollen, ecchymotic, NVID.  Skin:    General: Skin is warm and dry.  Neurological:     Mental Status: She is alert.  Psychiatric:        Mood and Affect: Mood normal.      Assessment/Plan S/p fall with L comminuted intraarticular proximal humerus fracture Plan L reverse TSA Risks / benefits of surgery discussed Consent on chart  NPO for OR Preop antibiotics   Rhae Hammock, MD 05/22/2022, 9:21 AM

## 2022-05-22 NOTE — Transfer of Care (Signed)
Immediate Anesthesia Transfer of Care Note  Patient: Tammy Harris  Procedure(s) Performed: LEFT REVERSE SHOULDER ARTHROPLASTY (Left: Shoulder)  Patient Location: PACU  Anesthesia Type:General  Level of Consciousness: awake, alert , and oriented  Airway & Oxygen Therapy: Patient Spontanous Breathing and Patient connected to face mask oxygen  Post-op Assessment: Report given to RN and Post -op Vital signs reviewed and stable  Post vital signs: Reviewed and stable  Last Vitals:  Vitals Value Taken Time  BP 155/92 05/22/22 1200  Temp    Pulse 99 05/22/22 1201  Resp 16 05/22/22 1201  SpO2 99 % 05/22/22 1201  Vitals shown include unvalidated device data.  Last Pain:  Vitals:   05/22/22 0924  TempSrc:   PainSc: 0-No pain         Complications: No notable events documented.

## 2022-05-22 NOTE — Anesthesia Procedure Notes (Signed)
Procedure Name: Intubation Date/Time: 05/22/2022 10:03 AM  Performed by: Gean Maidens, CRNAPre-anesthesia Checklist: Patient identified, Emergency Drugs available, Suction available, Patient being monitored and Timeout performed Patient Re-evaluated:Patient Re-evaluated prior to induction Oxygen Delivery Method: Circle system utilized Preoxygenation: Pre-oxygenation with 100% oxygen Induction Type: IV induction Ventilation: Mask ventilation without difficulty Laryngoscope Size: Glidescope and 4 Grade View: Grade I Tube type: Oral Tube size: 7.0 mm Number of attempts: 1 Airway Equipment and Method: Stylet and Video-laryngoscopy Placement Confirmation: ETT inserted through vocal cords under direct vision, positive ETCO2 and breath sounds checked- equal and bilateral Secured at: 21 cm Tube secured with: Tape Dental Injury: Teeth and Oropharynx as per pre-operative assessment

## 2022-05-22 NOTE — Discharge Instructions (Addendum)

## 2022-05-23 ENCOUNTER — Encounter (HOSPITAL_COMMUNITY): Payer: Self-pay | Admitting: Orthopedic Surgery

## 2022-05-28 ENCOUNTER — Other Ambulatory Visit: Payer: Self-pay | Admitting: Family Medicine

## 2022-06-04 DIAGNOSIS — S42292A Other displaced fracture of upper end of left humerus, initial encounter for closed fracture: Secondary | ICD-10-CM | POA: Diagnosis not present

## 2022-06-24 ENCOUNTER — Other Ambulatory Visit: Payer: Self-pay | Admitting: Family Medicine

## 2022-07-02 DIAGNOSIS — S42292A Other displaced fracture of upper end of left humerus, initial encounter for closed fracture: Secondary | ICD-10-CM | POA: Diagnosis not present

## 2022-07-10 DIAGNOSIS — M25612 Stiffness of left shoulder, not elsewhere classified: Secondary | ICD-10-CM | POA: Diagnosis not present

## 2022-07-10 DIAGNOSIS — Z96612 Presence of left artificial shoulder joint: Secondary | ICD-10-CM | POA: Diagnosis not present

## 2022-07-16 DIAGNOSIS — Z96612 Presence of left artificial shoulder joint: Secondary | ICD-10-CM | POA: Diagnosis not present

## 2022-07-16 DIAGNOSIS — M25612 Stiffness of left shoulder, not elsewhere classified: Secondary | ICD-10-CM | POA: Diagnosis not present

## 2022-07-18 DIAGNOSIS — Z96612 Presence of left artificial shoulder joint: Secondary | ICD-10-CM | POA: Diagnosis not present

## 2022-07-18 DIAGNOSIS — M25612 Stiffness of left shoulder, not elsewhere classified: Secondary | ICD-10-CM | POA: Diagnosis not present

## 2022-07-20 ENCOUNTER — Other Ambulatory Visit: Payer: Self-pay | Admitting: Family Medicine

## 2022-07-22 DIAGNOSIS — Z96612 Presence of left artificial shoulder joint: Secondary | ICD-10-CM | POA: Diagnosis not present

## 2022-07-22 DIAGNOSIS — M25612 Stiffness of left shoulder, not elsewhere classified: Secondary | ICD-10-CM | POA: Diagnosis not present

## 2022-07-24 DIAGNOSIS — M25612 Stiffness of left shoulder, not elsewhere classified: Secondary | ICD-10-CM | POA: Diagnosis not present

## 2022-07-24 DIAGNOSIS — Z96612 Presence of left artificial shoulder joint: Secondary | ICD-10-CM | POA: Diagnosis not present

## 2022-07-29 DIAGNOSIS — M25612 Stiffness of left shoulder, not elsewhere classified: Secondary | ICD-10-CM | POA: Diagnosis not present

## 2022-07-29 DIAGNOSIS — Z96612 Presence of left artificial shoulder joint: Secondary | ICD-10-CM | POA: Diagnosis not present

## 2022-07-31 DIAGNOSIS — Z96612 Presence of left artificial shoulder joint: Secondary | ICD-10-CM | POA: Diagnosis not present

## 2022-07-31 DIAGNOSIS — M25612 Stiffness of left shoulder, not elsewhere classified: Secondary | ICD-10-CM | POA: Diagnosis not present

## 2022-08-05 DIAGNOSIS — M25612 Stiffness of left shoulder, not elsewhere classified: Secondary | ICD-10-CM | POA: Diagnosis not present

## 2022-08-05 DIAGNOSIS — Z96612 Presence of left artificial shoulder joint: Secondary | ICD-10-CM | POA: Diagnosis not present

## 2022-08-06 ENCOUNTER — Ambulatory Visit: Payer: Medicare Other | Admitting: Family Medicine

## 2022-08-07 DIAGNOSIS — M25612 Stiffness of left shoulder, not elsewhere classified: Secondary | ICD-10-CM | POA: Diagnosis not present

## 2022-08-07 DIAGNOSIS — Z96612 Presence of left artificial shoulder joint: Secondary | ICD-10-CM | POA: Diagnosis not present

## 2022-08-12 DIAGNOSIS — M25612 Stiffness of left shoulder, not elsewhere classified: Secondary | ICD-10-CM | POA: Diagnosis not present

## 2022-08-12 DIAGNOSIS — Z96612 Presence of left artificial shoulder joint: Secondary | ICD-10-CM | POA: Diagnosis not present

## 2022-08-13 ENCOUNTER — Other Ambulatory Visit: Payer: Self-pay | Admitting: Family Medicine

## 2022-08-14 DIAGNOSIS — Z96612 Presence of left artificial shoulder joint: Secondary | ICD-10-CM | POA: Diagnosis not present

## 2022-08-14 DIAGNOSIS — M25512 Pain in left shoulder: Secondary | ICD-10-CM | POA: Diagnosis not present

## 2022-08-14 DIAGNOSIS — M25612 Stiffness of left shoulder, not elsewhere classified: Secondary | ICD-10-CM | POA: Diagnosis not present

## 2022-08-19 DIAGNOSIS — Z96612 Presence of left artificial shoulder joint: Secondary | ICD-10-CM | POA: Diagnosis not present

## 2022-08-19 DIAGNOSIS — M25612 Stiffness of left shoulder, not elsewhere classified: Secondary | ICD-10-CM | POA: Diagnosis not present

## 2022-08-26 DIAGNOSIS — Z96612 Presence of left artificial shoulder joint: Secondary | ICD-10-CM | POA: Diagnosis not present

## 2022-08-26 DIAGNOSIS — M25612 Stiffness of left shoulder, not elsewhere classified: Secondary | ICD-10-CM | POA: Diagnosis not present

## 2022-08-28 DIAGNOSIS — M25612 Stiffness of left shoulder, not elsewhere classified: Secondary | ICD-10-CM | POA: Diagnosis not present

## 2022-08-28 DIAGNOSIS — Z96612 Presence of left artificial shoulder joint: Secondary | ICD-10-CM | POA: Diagnosis not present

## 2022-09-02 DIAGNOSIS — Z96612 Presence of left artificial shoulder joint: Secondary | ICD-10-CM | POA: Diagnosis not present

## 2022-09-02 DIAGNOSIS — M25612 Stiffness of left shoulder, not elsewhere classified: Secondary | ICD-10-CM | POA: Diagnosis not present

## 2022-09-02 DIAGNOSIS — R200 Anesthesia of skin: Secondary | ICD-10-CM | POA: Diagnosis not present

## 2022-09-04 DIAGNOSIS — Z96612 Presence of left artificial shoulder joint: Secondary | ICD-10-CM | POA: Diagnosis not present

## 2022-09-04 DIAGNOSIS — M25612 Stiffness of left shoulder, not elsewhere classified: Secondary | ICD-10-CM | POA: Diagnosis not present

## 2022-09-09 ENCOUNTER — Other Ambulatory Visit: Payer: Self-pay | Admitting: Family Medicine

## 2022-09-09 DIAGNOSIS — Z96612 Presence of left artificial shoulder joint: Secondary | ICD-10-CM | POA: Diagnosis not present

## 2022-09-09 DIAGNOSIS — M25612 Stiffness of left shoulder, not elsewhere classified: Secondary | ICD-10-CM | POA: Diagnosis not present

## 2022-09-11 DIAGNOSIS — M25612 Stiffness of left shoulder, not elsewhere classified: Secondary | ICD-10-CM | POA: Diagnosis not present

## 2022-09-11 DIAGNOSIS — Z96612 Presence of left artificial shoulder joint: Secondary | ICD-10-CM | POA: Diagnosis not present

## 2022-09-16 DIAGNOSIS — M25612 Stiffness of left shoulder, not elsewhere classified: Secondary | ICD-10-CM | POA: Diagnosis not present

## 2022-09-16 DIAGNOSIS — Z96612 Presence of left artificial shoulder joint: Secondary | ICD-10-CM | POA: Diagnosis not present

## 2022-09-18 DIAGNOSIS — Z96612 Presence of left artificial shoulder joint: Secondary | ICD-10-CM | POA: Diagnosis not present

## 2022-09-18 DIAGNOSIS — M25612 Stiffness of left shoulder, not elsewhere classified: Secondary | ICD-10-CM | POA: Diagnosis not present

## 2022-09-23 DIAGNOSIS — M25612 Stiffness of left shoulder, not elsewhere classified: Secondary | ICD-10-CM | POA: Diagnosis not present

## 2022-09-23 DIAGNOSIS — Z96612 Presence of left artificial shoulder joint: Secondary | ICD-10-CM | POA: Diagnosis not present

## 2022-09-25 DIAGNOSIS — M25612 Stiffness of left shoulder, not elsewhere classified: Secondary | ICD-10-CM | POA: Diagnosis not present

## 2022-09-25 DIAGNOSIS — Z96612 Presence of left artificial shoulder joint: Secondary | ICD-10-CM | POA: Diagnosis not present

## 2022-09-30 DIAGNOSIS — M25612 Stiffness of left shoulder, not elsewhere classified: Secondary | ICD-10-CM | POA: Diagnosis not present

## 2022-09-30 DIAGNOSIS — Z96612 Presence of left artificial shoulder joint: Secondary | ICD-10-CM | POA: Diagnosis not present

## 2022-10-06 ENCOUNTER — Other Ambulatory Visit: Payer: Self-pay | Admitting: Family Medicine

## 2022-10-11 ENCOUNTER — Other Ambulatory Visit: Payer: Self-pay | Admitting: Family Medicine

## 2022-10-11 DIAGNOSIS — I10 Essential (primary) hypertension: Secondary | ICD-10-CM

## 2022-10-11 DIAGNOSIS — E785 Hyperlipidemia, unspecified: Secondary | ICD-10-CM

## 2022-11-08 ENCOUNTER — Other Ambulatory Visit: Payer: Self-pay | Admitting: Family Medicine

## 2022-12-04 ENCOUNTER — Other Ambulatory Visit: Payer: Self-pay | Admitting: Family Medicine

## 2022-12-04 DIAGNOSIS — E119 Type 2 diabetes mellitus without complications: Secondary | ICD-10-CM

## 2022-12-06 ENCOUNTER — Other Ambulatory Visit: Payer: Self-pay | Admitting: Family Medicine

## 2023-01-05 ENCOUNTER — Other Ambulatory Visit: Payer: Self-pay | Admitting: Family Medicine

## 2023-01-22 ENCOUNTER — Ambulatory Visit (INDEPENDENT_AMBULATORY_CARE_PROVIDER_SITE_OTHER): Payer: Medicare Other | Admitting: Family Medicine

## 2023-01-22 ENCOUNTER — Encounter: Payer: Self-pay | Admitting: Family Medicine

## 2023-01-22 VITALS — BP 148/82 | HR 88 | Temp 98.0°F | Wt 250.4 lb

## 2023-01-22 DIAGNOSIS — E1169 Type 2 diabetes mellitus with other specified complication: Secondary | ICD-10-CM | POA: Diagnosis not present

## 2023-01-22 DIAGNOSIS — Z7984 Long term (current) use of oral hypoglycemic drugs: Secondary | ICD-10-CM | POA: Diagnosis not present

## 2023-01-22 DIAGNOSIS — I10 Essential (primary) hypertension: Secondary | ICD-10-CM | POA: Diagnosis not present

## 2023-01-22 DIAGNOSIS — Z23 Encounter for immunization: Secondary | ICD-10-CM | POA: Diagnosis not present

## 2023-01-22 DIAGNOSIS — E785 Hyperlipidemia, unspecified: Secondary | ICD-10-CM | POA: Diagnosis not present

## 2023-01-22 DIAGNOSIS — E119 Type 2 diabetes mellitus without complications: Secondary | ICD-10-CM

## 2023-01-22 LAB — COMPREHENSIVE METABOLIC PANEL
ALT: 20 U/L (ref 0–35)
AST: 16 U/L (ref 0–37)
Albumin: 3.9 g/dL (ref 3.5–5.2)
Alkaline Phosphatase: 54 U/L (ref 39–117)
BUN: 12 mg/dL (ref 6–23)
CO2: 29 meq/L (ref 19–32)
Calcium: 9.6 mg/dL (ref 8.4–10.5)
Chloride: 93 meq/L — ABNORMAL LOW (ref 96–112)
Creatinine, Ser: 0.6 mg/dL (ref 0.40–1.20)
GFR: 91.34 mL/min (ref >60.00)
Glucose, Bld: 101 mg/dL — ABNORMAL HIGH (ref 70–99)
Potassium: 3.9 meq/L (ref 3.5–5.1)
Sodium: 131 meq/L — ABNORMAL LOW (ref 135–145)
Total Bilirubin: 0.5 mg/dL (ref 0.2–1.2)
Total Protein: 6.9 g/dL (ref 6.0–8.3)

## 2023-01-22 LAB — CBC WITH DIFFERENTIAL/PLATELET
Basophils Absolute: 0.1 10*3/uL (ref 0.0–0.1)
Basophils Relative: 0.8 % (ref 0.0–3.0)
Eosinophils Absolute: 0.1 10*3/uL (ref 0.0–0.7)
Eosinophils Relative: 1.4 % (ref 0.0–5.0)
HCT: 41.6 % (ref 36.0–46.0)
Hemoglobin: 13.2 g/dL (ref 12.0–15.0)
Lymphocytes Relative: 28 % (ref 12.0–46.0)
Lymphs Abs: 2.8 10*3/uL (ref 0.7–4.0)
MCHC: 31.7 g/dL (ref 30.0–36.0)
MCV: 88.6 fl (ref 78.0–100.0)
Monocytes Absolute: 0.6 10*3/uL (ref 0.1–1.0)
Monocytes Relative: 5.6 % (ref 3.0–12.0)
Neutro Abs: 6.5 10*3/uL (ref 1.4–7.7)
Neutrophils Relative %: 64.2 % (ref 43.0–77.0)
Platelets: 352 10*3/uL (ref 150.0–400.0)
RBC: 4.69 Mil/uL (ref 3.87–5.11)
RDW: 13.7 % (ref 11.5–15.5)
WBC: 10.1 10*3/uL (ref 4.0–10.5)

## 2023-01-22 LAB — HEMOGLOBIN A1C: Hgb A1c MFr Bld: 6.3 % (ref 4.6–6.5)

## 2023-01-22 LAB — LIPID PANEL
Cholesterol: 155 mg/dL (ref 0–200)
HDL: 58.5 mg/dL (ref >39.00)
LDL Cholesterol: 68 mg/dL (ref 0–99)
NonHDL: 96.49
Total CHOL/HDL Ratio: 3
Triglycerides: 142 mg/dL (ref 0.0–149.0)
VLDL: 28.4 mg/dL (ref 0.0–40.0)

## 2023-01-22 MED ORDER — METHOCARBAMOL 500 MG PO TABS: 500.0000 mg | ORAL_TABLET | Freq: Three times a day (TID) | ORAL | 11 refills | Status: DC

## 2023-01-22 MED ORDER — METHOCARBAMOL 500 MG PO TABS: 500.0000 mg | ORAL_TABLET | Freq: Three times a day (TID) | ORAL | 3 refills | Status: DC

## 2023-01-22 MED ORDER — FLUCONAZOLE 100 MG PO TABS: 150.0000 mg | ORAL_TABLET | Freq: Every day | ORAL | 0 refills | Status: AC | PRN

## 2023-01-22 NOTE — Patient Instructions (Addendum)
Get Mammogram and diabetic eye exam scheduled.  Let us know if you get a COVID vaccine this fall or shingrix shot.   Please stop by lab before you go If you have mychart- we will send your results within 3 business days of [place] receiving them.  If you do not have mychart- we will call you about results within 5 business days of [place] receiving them.  *please also note that you will see labs on mychart as soon as they post. I will later go in and write notes on them- will say "notes from Dr. Durene Cal"   You are eligible to schedule your annual wellness visit with our nurse specialist Inetta Fermo.  Please consider scheduling this before you leave today  Recommended follow up: Return in about 6 months (around 07/22/2023) for physical or sooner if needed.Schedule b4 you leave.

## 2023-01-22 NOTE — Addendum Note (Signed)
Addended by: Gwenette Greet on: 01/22/2023 01:13 PM   Modules accepted: Orders

## 2023-01-22 NOTE — Progress Notes (Signed)
Phone [phone] In person visit   Subjective:   Tammy Harris is a 72 y.o. year old very pleasant female patient who presents for/with See problem oriented charting Chief Complaint  Patient presents with   Medical Management of Chronic Issues   Hyperlipidemia   Hypertension   Diabetes   Past Medical History-  Patient Active Problem List   Diagnosis Date Noted   History of breast cancer 12/13/2014    Priority: High   Morbid obesity (HCC) 08/15/2014    Priority: High   Diabetes (HCC) 03/30/2012    Priority: High   Frequent headaches     Priority: Medium    Essential hypertension 03/30/2012    Priority: Medium    Hyperlipidemia associated with type 2 diabetes mellitus (HCC) 03/30/2012    Priority: Medium    Low back pain 12/26/2016    Priority: Low    Medications- reviewed and updated Current Outpatient Medications  Medication Sig Dispense Refill   amLODipine (NORVASC) 5 MG tablet TAKE 1 TABLET BY MOUTH DAILY 100 tablet 2   Flaxseed, Linseed, (FLAX SEEDS PO) Take 1 capsule by mouth daily.     fluticasone (FLONASE) 50 MCG/ACT nasal spray USE 2 SPRAYS INTO EACH NOSTRIL EVERY DAY 48 g 2   hydrochlorothiazide (HYDRODIURIL) 25 MG tablet TAKE 1 TABLET BY MOUTH DAILY 100 tablet 2   losartan (COZAAR) 100 MG tablet TAKE 1 TABLET BY MOUTH DAILY 100 tablet 2   metFORMIN (GLUCOPHAGE) 1000 MG tablet TAKE 1 TABLET BY MOUTH TWICE  DAILY WITH MEALS 200 tablet 2   Multiple Vitamin (MULTIVITAMIN WITH MINERALS) TABS tablet Take 1 tablet by mouth daily.     Omega-3 Fatty Acids (FISH OIL PO) Take 1 capsule by mouth daily.     pravastatin (PRAVACHOL) 40 MG tablet TAKE 1 TABLET BY MOUTH DAILY 100 tablet 2   VITAMIN D PO Take 1 tablet by mouth daily.     fluconazole (DIFLUCAN) 100 MG tablet Take 1.5 tablets (150 mg total) by mouth daily as needed (as needed for intermittent yeast infections (one dose per treatment ideally)). 6 tablet 0   Melatonin 10 MG TABS Take 10 mg by mouth at  bedtime as needed (sleep). (Patient not taking: Reported on 01/22/2023)     methocarbamol (ROBAXIN) 500 MG tablet Take 1 tablet (500 mg total) by mouth 3 (three) times daily. 270 tablet 3   No current facility-administered medications for this visit.     Objective:  BP (!) 148/82   Pulse 88   Temp 98 F (36.7 C) (Temporal)   Wt 250 lb 6.4 oz (113.6 kg)   SpO2 100%   BMI 42.98 kg/m  Gen: NAD, resting comfortably CV: RRR no murmurs rubs or gallops Lungs: CTAB no crackles, wheeze, rhonchi Ext: trace edema Skin: warm, dry     Assessment and Plan   #Left shoulder fracture  in 2023- some loss of function since that time. Dr. Ave Filter is the surgeon.  - also some numbness issues thought to be carpal tunnel- getting some better- wants to monitor- goes up into arm as well  #Breast cancer history- plans to schedule mammogram with her GYN office reluctantly- skipped last year  # Diabetes S: Compliant with Metformin 1000 mg twice a day Exercise and diet- weight stable from last visit. Closed pool up so needs new strategy but active in the summer and with physical therapy shoulder Wt Readings from Last 3 Encounters:  01/22/23 250 lb 6.4 oz (113.6 kg)  05/22/22 250  lb 12.8 oz (113.8 kg)  05/21/22 250 lb 12.8 oz (113.8 kg)  A/P: hopefully stable- update a1c today. Continue current meds for now    #hypertension S: compliant with hydrochlorothiazide 25 mg, losartan 100 mg, amlodipine 5 mg BP Readings from Last 3 Encounters:  01/22/23 (!) 148/82  05/22/22 114/70  05/21/22 (!) 146/101  A/P: goal at home at least <140/90-she will monitor over next week and let me know readings- we could trial amlodipine 10 mg but may cause more swelling and has already had issues with ravel at times- otherwise may need to look at beta blockers or spironolactone   #hyperlipidemia S: compliant with pravastatin 40 mg Lab Results  Component Value Date   CHOL 158 02/04/2022   HDL 53.70 02/04/2022   LDLCALC  79 02/04/2022   LDLDIRECT 100.0 07/29/2018   TRIG 128.0 02/04/2022   CHOLHDL 3 02/04/2022   A/P: hopefully stable- close to ideal goals also can work on healthy eating and regular exercise - update lipid panel today. Continue current meds for now   #Low back pain S: Patient with history of degenerative disc disease based on MRI of lumbar spine in the past.  Intermittent flares at times and uses methocarbamol A/P: tolerable with long term methocarbamol  -back surgeon has told her to avoid surgery   # headaches frequent- very sparing excedrin to avoid rebound headaches - no worse lately. Thinks barometric pressure is a trigger  #morbid obesity- healthy eating and regular exercise   Recommended follow up: Return in about 6 months (around 07/22/2023) for physical or sooner if needed.Schedule b4 you leave.  Lab/Order associations:   ICD-10-CM   1. Type 2 diabetes mellitus without complication, without long-term current use of insulin (HCC)  E11.9 Comprehensive metabolic panel    CBC with Differential/Platelet    Hemoglobin A1c    Lipid panel    Microalbumin / creatinine urine ratio    2. Essential hypertension  I10     3. Hyperlipidemia associated with type 2 diabetes mellitus (HCC)  E11.69    E78.5     4. Morbid obesity (HCC) Chronic E66.01     5. Need for influenza vaccination  Z23 Flu Vaccine Trivalent High Dose (Fluad)      Meds ordered this encounter  Medications   fluconazole (DIFLUCAN) 100 MG tablet    Sig: Take 1.5 tablets (150 mg total) by mouth daily as needed (as needed for intermittent yeast infections (one dose per treatment ideally)).    Dispense:  6 tablet    Refill:  0   DISCONTD: methocarbamol (ROBAXIN) 500 MG tablet    Sig: Take 1 tablet (500 mg total) by mouth 3 (three) times daily.    Dispense:  90 tablet    Refill:  11   methocarbamol (ROBAXIN) 500 MG tablet    Sig: Take 1 tablet (500 mg total) by mouth 3 (three) times daily.    Dispense:  270 tablet     Refill:  3    Return precautions advised.  Tana Conch, MD

## 2023-02-19 ENCOUNTER — Encounter: Payer: Self-pay | Admitting: *Deleted

## 2023-02-19 NOTE — Progress Notes (Signed)
This patient is due for a urine albumin/creatinine lab test Please consider scheduling a lab appointment to obtain

## 2023-04-23 ENCOUNTER — Telehealth: Payer: Self-pay

## 2023-04-23 NOTE — Telephone Encounter (Signed)
LVM for patient to return my call; pt needs to schedule lab only appt to close care [facility] for 2024.

## 2023-06-24 ENCOUNTER — Other Ambulatory Visit: Payer: Self-pay | Admitting: Family Medicine

## 2023-06-24 DIAGNOSIS — E785 Hyperlipidemia, unspecified: Secondary | ICD-10-CM

## 2023-06-24 DIAGNOSIS — I10 Essential (primary) hypertension: Secondary | ICD-10-CM

## 2023-08-11 ENCOUNTER — Encounter: Payer: Self-pay | Admitting: Family Medicine

## 2023-08-11 ENCOUNTER — Ambulatory Visit (INDEPENDENT_AMBULATORY_CARE_PROVIDER_SITE_OTHER): Payer: Medicare Other | Admitting: Family Medicine

## 2023-08-11 VITALS — BP 132/78 | HR 64 | Temp 98.0°F | Ht 64.0 in | Wt 246.2 lb

## 2023-08-11 DIAGNOSIS — E1169 Type 2 diabetes mellitus with other specified complication: Secondary | ICD-10-CM

## 2023-08-11 DIAGNOSIS — Z1211 Encounter for screening for malignant neoplasm of colon: Secondary | ICD-10-CM

## 2023-08-11 DIAGNOSIS — E785 Hyperlipidemia, unspecified: Secondary | ICD-10-CM | POA: Diagnosis not present

## 2023-08-11 DIAGNOSIS — E119 Type 2 diabetes mellitus without complications: Secondary | ICD-10-CM

## 2023-08-11 DIAGNOSIS — I10 Essential (primary) hypertension: Secondary | ICD-10-CM

## 2023-08-11 DIAGNOSIS — Z7984 Long term (current) use of oral hypoglycemic drugs: Secondary | ICD-10-CM

## 2023-08-11 DIAGNOSIS — Z0000 Encounter for general adult medical examination without abnormal findings: Secondary | ICD-10-CM | POA: Diagnosis not present

## 2023-08-11 LAB — COMPREHENSIVE METABOLIC PANEL WITH GFR
ALT: 20 U/L (ref 0–35)
AST: 18 U/L (ref 0–37)
Albumin: 4.3 g/dL (ref 3.5–5.2)
Alkaline Phosphatase: 56 U/L (ref 39–117)
BUN: 10 mg/dL (ref 6–23)
CO2: 31 meq/L (ref 19–32)
Calcium: 9.7 mg/dL (ref 8.4–10.5)
Chloride: 94 meq/L — ABNORMAL LOW (ref 96–112)
Creatinine, Ser: 0.6 mg/dL (ref 0.40–1.20)
GFR: 90.99 mL/min (ref >60.00)
Glucose, Bld: 118 mg/dL — ABNORMAL HIGH (ref 70–99)
Potassium: 3.6 meq/L (ref 3.5–5.1)
Sodium: 133 meq/L — ABNORMAL LOW (ref 135–145)
Total Bilirubin: 0.5 mg/dL (ref 0.2–1.2)
Total Protein: 7.3 g/dL (ref 6.0–8.3)

## 2023-08-11 LAB — CBC WITH DIFFERENTIAL/PLATELET
Basophils Absolute: 0.1 10*3/uL (ref 0.0–0.1)
Basophils Relative: 0.7 % (ref 0.0–3.0)
Eosinophils Absolute: 0.2 10*3/uL (ref 0.0–0.7)
Eosinophils Relative: 2.1 % (ref 0.0–5.0)
HCT: 42.6 % (ref 36.0–46.0)
Hemoglobin: 13.8 g/dL (ref 12.0–15.0)
Lymphocytes Relative: 28.1 % (ref 12.0–46.0)
Lymphs Abs: 2.8 10*3/uL (ref 0.7–4.0)
MCHC: 32.4 g/dL (ref 30.0–36.0)
MCV: 88.7 fl (ref 78.0–100.0)
Monocytes Absolute: 0.7 10*3/uL (ref 0.1–1.0)
Monocytes Relative: 7.2 % (ref 3.0–12.0)
Neutro Abs: 6.1 10*3/uL (ref 1.4–7.7)
Neutrophils Relative %: 61.9 % (ref 43.0–77.0)
Platelets: 347 10*3/uL (ref 150.0–400.0)
RBC: 4.8 Mil/uL (ref 3.87–5.11)
RDW: 13.6 % (ref 11.5–15.5)
WBC: 9.8 10*3/uL (ref 4.0–10.5)

## 2023-08-11 LAB — LIPID PANEL
Cholesterol: 169 mg/dL (ref 0–200)
HDL: 59.4 mg/dL (ref >39.00)
LDL Cholesterol: 83 mg/dL (ref 0–99)
NonHDL: 109.78
Total CHOL/HDL Ratio: 3
Triglycerides: 136 mg/dL (ref 0.0–149.0)
VLDL: 27.2 mg/dL (ref 0.0–40.0)

## 2023-08-11 LAB — MICROALBUMIN / CREATININE URINE RATIO
Creatinine,U: 75 mg/dL
Microalb Creat Ratio: UNDETERMINED mg/g (ref 0.0–30.0)
Microalb, Ur: <0.7 mg/dL

## 2023-08-11 LAB — HEMOGLOBIN A1C: Hgb A1c MFr Bld: 6.3 % (ref 4.6–6.5)

## 2023-08-11 NOTE — Progress Notes (Signed)
 Phone [phone]   Subjective:  Patient presents today for their annual physical. Chief complaint-noted.   See problem oriented charting- ROS- full  review of systems was completed and negative Per full ROS sheet completed by patient   The following were reviewed and entered/updated in epic: Past Medical History:  Diagnosis Date   Arthritis    Cancer (HCC) 2015   BREAST   Diabetes mellitus without complication (HCC)    Family history of adverse reaction to anesthesia    Son hard to wake up and wakes up during surgery   Frequent headaches    long term issues. saw neurology in the past. rarely takes rx- excedrin if bad   Hyperlipidemia    Hypertension    Patient Active Problem List   Diagnosis Date Noted   History of breast cancer 12/13/2014    Priority: High   Morbid obesity (HCC) 08/15/2014    Priority: High   Diabetes (HCC) 03/30/2012    Priority: High   Frequent headaches     Priority: Medium    Essential hypertension 03/30/2012    Priority: Medium    Hyperlipidemia associated with type 2 diabetes mellitus (HCC) 03/30/2012    Priority: Medium    Low back pain 12/26/2016    Priority: Low   Past Surgical History:  Procedure Laterality Date   BREAST SURGERY     lumpectomy bilateral, biopsy precceeded   CESAREAN SECTION     x2   ELBOW BURSA SURGERY Right    FOOT SURGERY     bone spurs    hernia groin left     as child   JOINT REPLACEMENT     NASAL POLYP EXCISION     REVERSE SHOULDER ARTHROPLASTY Left 05/22/2022   Procedure: LEFT REVERSE SHOULDER ARTHROPLASTY;  Surgeon: Jones Broom, MD;  Location: [facility];  Service: Orthopedics;  Laterality: Left;   TOTAL KNEE ARTHROPLASTY Bilateral 2008    Family History  Problem Relation Age of Onset   Hyperlipidemia Mother        57 in 2018   Atrial fibrillation Mother    Hypertension Father    Parkinson's disease Father    Healthy Sister    Hypertension Brother        overweight   Prostate cancer Brother     Breast cancer Maternal Grandmother        mets- led to death. great grandmother had stroke young.    Heart disease Paternal Grandmother        pacemaker   Diabetes Maternal Uncle    Hypothyroidism Sister        after removal    Medications- reviewed and updated Current Outpatient Medications  Medication Sig Dispense Refill   amLODipine (NORVASC) 5 MG tablet TAKE 1 TABLET BY MOUTH DAILY 100 tablet 2   Flaxseed, Linseed, (FLAX SEEDS PO) Take 1 capsule by mouth daily.     fluconazole (DIFLUCAN) 100 MG tablet Take 1.5 tablets (150 mg total) by mouth daily as needed (as needed for intermittent yeast infections (one dose per treatment ideally)). 6 tablet 0   fluticasone (FLONASE) 50 MCG/ACT nasal spray USE 2 SPRAYS INTO EACH NOSTRIL EVERY DAY 48 g 2   hydrochlorothiazide (HYDRODIURIL) 25 MG tablet TAKE 1 TABLET BY MOUTH DAILY 100 tablet 2   losartan (COZAAR) 100 MG tablet TAKE 1 TABLET BY MOUTH DAILY 100 tablet 2   Melatonin 10 MG TABS Take 10 mg by mouth at bedtime as needed (sleep).     metFORMIN (GLUCOPHAGE)  1000 MG tablet TAKE 1 TABLET BY MOUTH TWICE  DAILY WITH MEALS 200 tablet 2   methocarbamol (ROBAXIN) 500 MG tablet Take 1 tablet (500 mg total) by mouth 3 (three) times daily. 270 tablet 3   Multiple Vitamin (MULTIVITAMIN WITH MINERALS) TABS tablet Take 1 tablet by mouth daily.     Omega-3 Fatty Acids (FISH OIL PO) Take 1 capsule by mouth daily.     pravastatin (PRAVACHOL) 40 MG tablet TAKE 1 TABLET BY MOUTH DAILY 100 tablet 2   VITAMIN D PO Take 1 tablet by mouth daily.     No current facility-administered medications for this visit.    Allergies-reviewed and updated Allergies  Allergen Reactions   Ace Inhibitors Cough    Patient developed a cough with use of ACE inhibitors.   Latex     Unknown reaction    Sulfa Antibiotics Hives and Itching    Social History   Social History Narrative   Married 1975. 2 sons. 4 grandkids- all local (21 to 10 in 2018). Great grandchild  in 2022!       Pianist in church.    2 years of college.       Hobbies: enjoys watching football (since radiation)- enjoys NFL, music, movies, pool in back yard, grandkids   Objective  Objective:  BP 132/78   Pulse 64   Temp 98 F (36.7 C)   Ht 5\' 4"  (1.626 m)   Wt 246 lb 3.2 oz (111.7 kg)   SpO2 98%   BMI 42.26 kg/m  Gen: NAD, resting comfortably HEENT: Mucous membranes are moist. Oropharynx normal Neck: no thyromegaly CV: RRR no murmurs rubs or gallops Lungs: CTAB no crackles, wheeze, rhonchi Abdomen: soft/nontender/nondistended/normal bowel sounds. No rebound or guarding.  Ext: no edema Skin: warm, dry Neuro: grossly normal, moves all extremities, PERRLA   Assessment and Plan   73 y.o. female presenting for annual physical.  Health Maintenance counseling: 1. Anticipatory guidance: Patient counseled regarding regular dental exams - advised q6 months- some fear, eye exams - yearly advised,  avoiding smoking and second hand smoke , limiting alcohol to 1 beverage per day- doesn't drink , no illicit drugs .   2. Risk factor reduction:  Advised patient of need for regular exercise and diet rich and fruits and vegetables to reduce risk of heart attack and stroke.  Exercise- walking daily 5-10 minutes, also plans to get in pool in summertime at home- long term goal 150 minutes a week.  Diet/weight management-from last physical down another 6 pounds-prior peak weight of 304.6- weights daily and tries to be cautious with foods . Morbid obesity noted with BMI over 40- encouraged continued gradual weight loss Wt Readings from Last 3 Encounters:  08/11/23 246 lb 3.2 oz (111.7 kg)  01/22/23 250 lb 6.4 oz (113.6 kg)  05/22/22 250 lb 12.8 oz (113.8 kg)  3. Immunizations/screenings/ancillary studies-declines Shingrix (had 1st shot but holding off on 2nd- could complete if desired) and COVID but otherwise up-to-date Immunization History  Administered Date(s) Administered   Fluad Quad(high  Dose 65+) 02/02/2020, 02/04/2021, 02/04/2022   Fluad Trivalent(High Dose 65+) 01/22/2023   Influenza Split 03/30/2012   Influenza, Quadrivalent, Recombinant, Inj, Pf 03/26/2017   Influenza,inj,Quad PF,6+ Mos 01/11/2013, 12/20/2013, 03/04/2016, 01/28/2018   Influenza-Unspecified 01/11/2015, 02/01/2019   PFIZER(Purple Top)SARS-COV-2 Vaccination 07/02/2019, 07/26/2019, 02/28/2020   PNEUMOCOCCAL CONJUGATE-20 08/07/2021   Pneumococcal Conjugate-13 08/15/2014   Pneumococcal Polysaccharide-23 06/23/2016   Tdap 08/26/2011, 05/11/2022  4. Cervical cancer screening- past age based screening  recommendations. No longer with GYN  5. Breast cancer screening-patient with breast cancer history-synchronous bilateral treated with lumpectomy and radiation and continues follow-up with oncology at [facility] and prior on letrozole- now off.  Receives mammograms yearly-most recently reported in 2023- encouraged to schedule follow up mammogram- she's aware of risk of recurrence but declines for now   6. Colon cancer screening - 04/08/21 cologuard- repeat in 3 years-not yet due- apparently they sent it to the house- can complete in November or December 7. Skin cancer screening-does not see dermatology.  advised regular sunscreen use. Denies worrisome, changing, or new skin lesions.  8. Birth control/STD check-monogamous/postmenopausal  9. Osteoporosis screening at 27- DEXA scan completed 09/30/2017 noted as normal-oo for now   -Never smoker  Status of chronic or acute concerns    # Diabetes S: Compliant with Metformin 1000 mg twice a day Lab Results  Component Value Date   HGBA1C 6.3 01/22/2023   HGBA1C 6.2 (H) 05/21/2022   HGBA1C 6.5 02/04/2022   A/P: hopefully stable- update a1c  today. Continue current meds for now   I discussed the microalbumin to creatinine lab error with patient that occurred with [facility] labs.  Essentially the ratio was off by a factor of 10.  In this patient's individual case would  correct value to 15 - hopefully able to submit another specimen today   #hypertension S: compliant with hydrochlorothiazide 25 mg, losartan 100 mg, amlodipine 5 mg BP Readings from Last 3 Encounters:  08/11/23 132/78  01/22/23 (!) 148/82  05/22/22 114/70  A/P: well controlled continue current medications    #hyperlipidemia S: compliant with pravastatin 40 mg  Lab Results  Component Value Date   CHOL 155 01/22/2023   HDL 58.50 01/22/2023   LDLCALC 68 01/22/2023   LDLDIRECT 100.0 07/29/2018   TRIG 142.0 01/22/2023   CHOLHDL 3 01/22/2023   A/P: lipids at goal last visit- update today   #Low back pain S: Patient with history of degenerative disc disease based on MRI of lumbar spine in the past.  Intermittent flares at times and uses methocarbamol most of the time- needs helpful -was told to hold off of surgery A/P: doing ok lately- wants to monitor only. Certain positions can still trigger   # headaches frequent- very sparing excedrin to avoid rebound headaches. Thinks barometric pressure is a trigger so variable  #Intermittent yeast infections- vaginal creams have not worked in past- we let her have as needed fluconazole 150 mg as needed- sent in last visit  Recommended follow up: Return in about 6 months (around 02/10/2024) for followup or sooner if needed.Schedule b4 you leave.  Lab/Order associations: fasting   ICD-10-CM   1. Preventative health care  Z00.00     2. Screen for colon cancer  Z12.11     3. Type 2 diabetes mellitus without complication, without long-term current use of insulin (HCC)  E11.9 Urine Microalbumin w/creat. ratio    Hemoglobin A1c    Comprehensive metabolic panel with GFR    CBC with Differential/Platelet    Lipid panel    4. Essential hypertension  I10 Comprehensive metabolic panel with GFR    CBC with Differential/Platelet    Lipid panel    5. Hyperlipidemia associated with type 2 diabetes mellitus (HCC)  E11.69 Comprehensive metabolic panel  with GFR   E78.5 CBC with Differential/Platelet    Lipid panel      No orders of the defined types were placed in this encounter.   Return precautions  advised.  Tana Conch, MD

## 2023-08-11 NOTE — Patient Instructions (Addendum)
 Please stop by lab before you go If you have mychart- we will send your results within 3 business days of [place] receiving them.  If you do not have mychart- we will call you about results within 5 business days of [place] receiving them.  *please also note that you will see labs on mychart as soon as they post. I will later go in and write notes on them- will say "notes from Dr. Durene Cal"   Encourage dental follow up if you can get the courage up   Consider final shingrix shot at pharmacy  Let us know if you change your mind- would love to help you set up [facility]ogram [facility]  Complete cologuard in December  Get your updated eye exam and have them send [place] a copy- make sure they know you have diabetes   Recommended follow up: Return in about 6 months (around 02/10/2024) for followup or sooner if needed.Schedule b4 you leave.

## 2023-08-21 ENCOUNTER — Other Ambulatory Visit: Payer: Self-pay | Admitting: Family Medicine

## 2023-08-21 DIAGNOSIS — E119 Type 2 diabetes mellitus without complications: Secondary | ICD-10-CM

## 2024-01-27 ENCOUNTER — Other Ambulatory Visit: Payer: Self-pay | Admitting: Family Medicine

## 2024-02-10 ENCOUNTER — Encounter: Payer: Self-pay | Admitting: Family Medicine

## 2024-02-10 ENCOUNTER — Ambulatory Visit: Admitting: Family Medicine

## 2024-02-10 VITALS — BP 138/80 | HR 68 | Temp 97.3°F | Ht 64.0 in | Wt 243.6 lb

## 2024-02-10 DIAGNOSIS — Z7984 Long term (current) use of oral hypoglycemic drugs: Secondary | ICD-10-CM | POA: Diagnosis not present

## 2024-02-10 DIAGNOSIS — I10 Essential (primary) hypertension: Secondary | ICD-10-CM | POA: Diagnosis not present

## 2024-02-10 DIAGNOSIS — Z23 Encounter for immunization: Secondary | ICD-10-CM

## 2024-02-10 DIAGNOSIS — E1169 Type 2 diabetes mellitus with other specified complication: Secondary | ICD-10-CM | POA: Diagnosis not present

## 2024-02-10 DIAGNOSIS — E785 Hyperlipidemia, unspecified: Secondary | ICD-10-CM

## 2024-02-10 DIAGNOSIS — E119 Type 2 diabetes mellitus without complications: Secondary | ICD-10-CM | POA: Diagnosis not present

## 2024-02-10 DIAGNOSIS — Z79899 Other long term (current) drug therapy: Secondary | ICD-10-CM

## 2024-02-10 NOTE — Progress Notes (Signed)
 Phone [phone] In person visit   Subjective:   Tammy Harris is a 73 y.o. year old very pleasant female patient who presents for/with See problem oriented charting Chief Complaint  Patient presents with   Diabetes   Hypertension   Past Medical History-  Patient Active Problem List   Diagnosis Date Noted   History of breast cancer 12/13/2014    Priority: High   Morbid obesity (HCC) 08/15/2014    Priority: High   Diabetes (HCC) 03/30/2012    Priority: High   Frequent headaches     Priority: Medium    Essential hypertension 03/30/2012    Priority: Medium    Hyperlipidemia associated with type 2 diabetes mellitus (HCC) 03/30/2012    Priority: Medium    Low back pain 12/26/2016    Priority: Low    Medications- reviewed and updated Current Outpatient Medications  Medication Sig Dispense Refill   amLODipine  (NORVASC ) 5 MG tablet TAKE 1 TABLET BY MOUTH DAILY 100 tablet 2   Flaxseed, Linseed, (FLAX SEEDS PO) Take 1 capsule by mouth daily.     fluconazole  (DIFLUCAN ) 100 MG tablet Take 1.5 tablets (150 mg total) by mouth daily as needed (as needed for intermittent yeast infections (one dose per treatment ideally)). 6 tablet 0   fluticasone  (FLONASE ) 50 MCG/ACT nasal spray USE 2 SPRAYS INTO EACH NOSTRIL EVERY DAY 48 g 2   hydrochlorothiazide  (HYDRODIURIL ) 25 MG tablet TAKE 1 TABLET BY MOUTH DAILY 100 tablet 2   losartan  (COZAAR ) 100 MG tablet TAKE 1 TABLET BY MOUTH DAILY 100 tablet 2   Melatonin 10 MG TABS Take 10 mg by mouth at bedtime as needed (sleep).     metFORMIN  (GLUCOPHAGE ) 1000 MG tablet TAKE 1 TABLET BY MOUTH TWICE  DAILY WITH MEALS 200 tablet 2   methocarbamol  (ROBAXIN ) 500 MG tablet TAKE 1 TABLET BY MOUTH 3 TIMES  DAILY 270 tablet 3   Multiple Vitamin (MULTIVITAMIN WITH MINERALS) TABS tablet Take 1 tablet by mouth daily.     Omega-3 Fatty Acids (FISH OIL PO) Take 1 capsule by mouth daily.     pravastatin  (PRAVACHOL ) 40 MG tablet TAKE 1 TABLET BY MOUTH DAILY  100 tablet 2   VITAMIN D PO Take 1 tablet by mouth daily.     No current facility-administered medications for this visit.     Objective:  BP 138/80 (BP Location: Left Arm, Patient Position: Sitting, Cuff Size: Normal)   Pulse 68   Temp (!) 97.3 F (36.3 C) (Temporal)   Ht 5' 4 (1.626 m)   Wt 243 lb 9.6 oz (110.5 kg)   SpO2 96%   BMI 41.81 kg/m  Gen: NAD, resting comfortably CV: RRR no murmurs rubs or gallops Lungs: CTAB no crackles, wheeze, rhonchi Ext: trace edema but worsened from last viist Skin: warm, dry     Assessment and Plan   #Diabetes  S: Compliant with Metformin  1000 mg twice a day. Takes B12 OTC (available over the counter without a prescription)  Lab Results  Component Value Date   HGBA1C 6.3 08/11/2023   HGBA1C 6.3 01/22/2023   HGBA1C 6.2 (H) 05/21/2022  A/P: hopefully stable- update b12 today. Continue current meds for now   #hypertension S: compliant with hydrochlorothiazide  25 mg, losartan  100 mg, amlodipine  5 mg - slightly more swelling lately. Worse with prolonged sitting if feet down. Helps if CAN prop feet. Weight trending down 3 lbs from last year- less likely heart failure . Doesn't like compression stockings - doesn't check  home blood pressure  BP Readings from Last 3 Encounters:  02/10/24 138/80  08/11/23 132/78  01/22/23 (!) 148/82  A/P: blood pressure reasonably controlled- high normal so do not feel we can reduce the amlodipine  though would like to with edema -also check sodium with tendency to hyponatremia   #hyperlipidemia S: compliant with pravastatin  40 mg Lab Results  Component Value Date   CHOL 169 08/11/2023   HDL 59.40 08/11/2023   LDLCALC 83 08/11/2023   LDLDIRECT 100.0 07/29/2018   TRIG 136.0 08/11/2023   CHOLHDL 3 08/11/2023   A/P: LDL 83 at last vist close to ideal of 70 or less- working on healthy eating and regular exercise . Check LDL with mild weight loss  #Low back pain- ongoing but stable- continue to monitor    # headaches frequent- very sparing excedrin to avoid rebound headaches - no recent changes. Thinks barometric pressure is a trigger  Recommended follow up: Return in about 6 months (around 08/10/2024) for physical or sooner if needed.Schedule b4 you leave.  Lab/Order associations: NOT fasting   ICD-10-CM   1. Type 2 diabetes mellitus without complication, without long-term current use of insulin (HCC)  E11.9     2. Essential hypertension  I10     3. Hyperlipidemia associated with type 2 diabetes mellitus (HCC)  E11.69    E78.5     4. High risk medication use  Z79.899     5. Need for influenza vaccination  Z23 Flu vaccine HIGH DOSE PF(Fluzone Trivalent)      No orders of the defined types were placed in this encounter.   Return precautions advised.  Garnette Lukes, MD

## 2024-02-10 NOTE — Patient Instructions (Addendum)
 Please stop by lab before you go If you have mychart- we will send your results within 3 business days of us  receiving them.  If you do not have mychart- we will call you about results within 5 business days of us  receiving them.  *please also note that you will see labs on mychart as soon as they post. I will later go in and write notes on them- will say notes from Dr. Katrinka   No changes today unless labs lead us  to make changes  Great job on another 3 months down   Recommended follow up: Return in about 6 months (around 08/10/2024) for physical or sooner if needed.Schedule b4 you leave.

## 2024-02-11 ENCOUNTER — Ambulatory Visit: Payer: Self-pay | Admitting: Family Medicine

## 2024-02-11 LAB — LDL CHOLESTEROL, DIRECT: Direct LDL: 84 mg/dL

## 2024-02-11 LAB — VITAMIN B12: Vitamin B-12: >1500 pg/mL — ABNORMAL HIGH (ref 211–911)

## 2024-02-11 LAB — HEMOGLOBIN A1C
Hgb A1c MFr Bld: 6.1 % — ABNORMAL HIGH (ref <5.7)
Mean Plasma Glucose: 128 mg/dL
eAG (mmol/L): 7.1 mmol/L

## 2024-02-17 DIAGNOSIS — E119 Type 2 diabetes mellitus without complications: Secondary | ICD-10-CM | POA: Diagnosis not present

## 2024-02-17 LAB — OPHTHALMOLOGY REPORT-SCANNED

## 2024-02-19 ENCOUNTER — Other Ambulatory Visit: Payer: Self-pay | Admitting: Family Medicine

## 2024-02-19 ENCOUNTER — Telehealth: Payer: Self-pay | Admitting: Family Medicine

## 2024-02-19 DIAGNOSIS — E119 Type 2 diabetes mellitus without complications: Secondary | ICD-10-CM

## 2024-02-19 DIAGNOSIS — E1169 Type 2 diabetes mellitus with other specified complication: Secondary | ICD-10-CM

## 2024-02-19 NOTE — Telephone Encounter (Signed)
 Left patient vm to call back.   Patient will need to schedule a time to come back into the office to have CMP drawn.   It was not drawn at 10/1 visit.

## 2024-02-22 NOTE — Telephone Encounter (Signed)
**Note De-identified  [place] Obfuscation** Please advise 

## 2024-02-22 NOTE — Telephone Encounter (Signed)
 I'm ok with waiting for 6 months since other CMP levels have been so stable

## 2024-02-24 ENCOUNTER — Encounter: Payer: Self-pay | Admitting: Family Medicine

## 2024-03-05 ENCOUNTER — Other Ambulatory Visit: Payer: Self-pay | Admitting: Family Medicine

## 2024-03-05 DIAGNOSIS — E785 Hyperlipidemia, unspecified: Secondary | ICD-10-CM

## 2024-03-05 DIAGNOSIS — I10 Essential (primary) hypertension: Secondary | ICD-10-CM

## 2024-05-07 ENCOUNTER — Other Ambulatory Visit: Payer: Self-pay | Admitting: Family Medicine

## 2024-05-07 DIAGNOSIS — E119 Type 2 diabetes mellitus without complications: Secondary | ICD-10-CM

## 2024-08-18 ENCOUNTER — Encounter: Admitting: Family Medicine
